# Patient Record
Sex: Female | Born: 1990 | Race: Black or African American | Hispanic: No | Marital: Single | Smoking: Former smoker
Health system: Southern US, Community
[De-identification: ages and names within clinical notes are randomized; demographics above are authoritative.]

## PROBLEM LIST (undated history)

## (undated) DIAGNOSIS — Z789 Other specified health status: Secondary | ICD-10-CM

## (undated) HISTORY — PX: NO PAST SURGERIES: SHX2092

## (undated) HISTORY — PX: TONSILLECTOMY: SUR1361

---

## 2010-05-26 ENCOUNTER — Emergency Department (HOSPITAL_COMMUNITY): Payer: Self-pay

## 2010-05-26 ENCOUNTER — Emergency Department (HOSPITAL_COMMUNITY)
Admission: EM | Admit: 2010-05-26 | Discharge: 2010-05-27 | Disposition: A | Discharge status: Home / Self Care | Payer: Self-pay | Attending: Emergency Medicine | Admitting: Emergency Medicine

## 2010-05-26 DIAGNOSIS — R1031 Right lower quadrant pain: Secondary | ICD-10-CM | POA: Insufficient documentation

## 2010-05-26 DIAGNOSIS — R110 Nausea: Secondary | ICD-10-CM | POA: Insufficient documentation

## 2010-05-26 DIAGNOSIS — N390 Urinary tract infection, site not specified: Secondary | ICD-10-CM | POA: Insufficient documentation

## 2010-05-26 LAB — DIFFERENTIAL
Basophils Absolute: 0 10*3/uL (ref 0.0–0.1)
Eosinophils Relative: 0 % (ref 0–5)
Lymphocytes Relative: 17 % (ref 12–46)
Monocytes Absolute: 1.4 10*3/uL — ABNORMAL HIGH (ref 0.1–1.0)
Monocytes Relative: 13 % — ABNORMAL HIGH (ref 3–12)
Neutro Abs: 7.3 10*3/uL (ref 1.7–7.7)

## 2010-05-26 LAB — URINALYSIS, ROUTINE W REFLEX MICROSCOPIC
Ketones, ur: 15 mg/dL — AB
Nitrite: NEGATIVE
Protein, ur: NEGATIVE mg/dL
Urine Glucose, Fasting: NEGATIVE mg/dL
pH: 6 (ref 5.0–8.0)

## 2010-05-26 LAB — BASIC METABOLIC PANEL
BUN: 10 mg/dL (ref 6–23)
CO2: 24 mEq/L (ref 19–32)
Calcium: 9 mg/dL (ref 8.4–10.5)
Creatinine, Ser: 0.92 mg/dL (ref 0.4–1.2)
GFR calc non Af Amer: >60 mL/min (ref >60)
Glucose, Bld: 101 mg/dL — ABNORMAL HIGH (ref 70–99)

## 2010-05-26 LAB — CBC
HCT: 34.3 % — ABNORMAL LOW (ref 36.0–46.0)
Hemoglobin: 11.8 g/dL — ABNORMAL LOW (ref 12.0–15.0)
MCH: 28.2 pg (ref 26.0–34.0)
MCHC: 34.4 g/dL (ref 30.0–36.0)
RDW: 14 % (ref 11.5–15.5)

## 2010-05-26 LAB — URINE MICROSCOPIC-ADD ON

## 2010-05-26 LAB — WET PREP, GENITAL

## 2010-05-26 LAB — POCT PREGNANCY, URINE: Preg Test, Ur: NEGATIVE

## 2010-05-27 ENCOUNTER — Encounter (HOSPITAL_COMMUNITY): Payer: Self-pay

## 2010-05-27 LAB — GC/CHLAMYDIA PROBE AMP, GENITAL: Chlamydia, DNA Probe: NEGATIVE

## 2010-05-27 MED ORDER — IOHEXOL 300 MG/ML  SOLN
100.0000 mL | Freq: Once | INTRAMUSCULAR | Status: AC | PRN
  Administered 2010-05-27: 100 mL via INTRAVENOUS

## 2010-07-19 ENCOUNTER — Emergency Department (HOSPITAL_BASED_OUTPATIENT_CLINIC_OR_DEPARTMENT_OTHER)
Admission: EM | Admit: 2010-07-19 | Discharge: 2010-07-19 | Disposition: A | Discharge status: Home / Self Care | Payer: Self-pay | Attending: Emergency Medicine | Admitting: Emergency Medicine

## 2010-07-19 DIAGNOSIS — F17200 Nicotine dependence, unspecified, uncomplicated: Secondary | ICD-10-CM | POA: Insufficient documentation

## 2010-07-19 DIAGNOSIS — B9689 Other specified bacterial agents as the cause of diseases classified elsewhere: Secondary | ICD-10-CM | POA: Insufficient documentation

## 2010-07-19 DIAGNOSIS — N760 Acute vaginitis: Secondary | ICD-10-CM | POA: Insufficient documentation

## 2010-07-19 DIAGNOSIS — N3090 Cystitis, unspecified without hematuria: Secondary | ICD-10-CM | POA: Insufficient documentation

## 2010-07-19 DIAGNOSIS — A499 Bacterial infection, unspecified: Secondary | ICD-10-CM | POA: Insufficient documentation

## 2010-07-19 LAB — URINALYSIS, ROUTINE W REFLEX MICROSCOPIC
Glucose, UA: NEGATIVE mg/dL
Ketones, ur: 40 mg/dL — AB
Nitrite: NEGATIVE
Specific Gravity, Urine: 1.035 — ABNORMAL HIGH (ref 1.005–1.030)
pH: 5.5 (ref 5.0–8.0)

## 2010-07-19 LAB — WET PREP, GENITAL: Yeast Wet Prep HPF POC: NONE SEEN

## 2010-07-19 LAB — URINE MICROSCOPIC-ADD ON

## 2010-07-20 LAB — GC/CHLAMYDIA PROBE AMP, GENITAL
Chlamydia, DNA Probe: NEGATIVE
GC Probe Amp, Genital: NEGATIVE

## 2011-06-16 ENCOUNTER — Encounter (HOSPITAL_BASED_OUTPATIENT_CLINIC_OR_DEPARTMENT_OTHER): Payer: Self-pay | Admitting: *Deleted

## 2011-06-16 ENCOUNTER — Emergency Department (HOSPITAL_BASED_OUTPATIENT_CLINIC_OR_DEPARTMENT_OTHER)
Admission: EM | Admit: 2011-06-16 | Discharge: 2011-06-16 | Disposition: A | Discharge status: Home Health | Payer: Self-pay | Attending: Emergency Medicine | Admitting: Emergency Medicine

## 2011-06-16 DIAGNOSIS — B373 Candidiasis of vulva and vagina: Secondary | ICD-10-CM

## 2011-06-16 DIAGNOSIS — N760 Acute vaginitis: Secondary | ICD-10-CM | POA: Insufficient documentation

## 2011-06-16 DIAGNOSIS — B3731 Acute candidiasis of vulva and vagina: Secondary | ICD-10-CM | POA: Insufficient documentation

## 2011-06-16 DIAGNOSIS — A499 Bacterial infection, unspecified: Secondary | ICD-10-CM | POA: Insufficient documentation

## 2011-06-16 DIAGNOSIS — B9689 Other specified bacterial agents as the cause of diseases classified elsewhere: Secondary | ICD-10-CM | POA: Insufficient documentation

## 2011-06-16 LAB — URINALYSIS, ROUTINE W REFLEX MICROSCOPIC
Bilirubin Urine: NEGATIVE
Hgb urine dipstick: NEGATIVE
Nitrite: NEGATIVE
Protein, ur: NEGATIVE mg/dL
Urobilinogen, UA: 1 mg/dL (ref 0.0–1.0)

## 2011-06-16 LAB — PREGNANCY, URINE: Preg Test, Ur: NEGATIVE

## 2011-06-16 LAB — WET PREP, GENITAL

## 2011-06-16 MED ORDER — METRONIDAZOLE 500 MG PO TABS
500.0000 mg | ORAL_TABLET | Freq: Once | ORAL | Status: AC
  Administered 2011-06-16: 500 mg via ORAL
  Filled 2011-06-16: qty 1

## 2011-06-16 MED ORDER — METRONIDAZOLE 500 MG PO TABS: 500.0000 mg | ORAL_TABLET | Freq: Two times a day (BID) | ORAL | Status: AC

## 2011-06-16 MED ORDER — FLUCONAZOLE 200 MG PO TABS: 200.0000 mg | ORAL_TABLET | Freq: Every day | ORAL | Status: AC

## 2011-06-16 NOTE — ED Provider Notes (Signed)
History     CSN: [id number]  Arrival date & time 06/16/11  2046   First MD Initiated Contact with Patient 06/16/11 2202      Chief Complaint  Patient presents with  . SEXUALLY TRANSMITTED DISEASE    (Consider location/radiation/quality/duration/timing/severity/associated sxs/prior treatment) The history is provided by the patient.   21 year old female comes in because of vaginal discharge for the last 3 days. Discharge is yellow and malodorous. Is getting worse. It is associated with some vaginal irritation which is significantly worse today. There is no associated abdominal pain, nausea, vomiting. She denies dysuria. She is on birth control pills but is not using condoms. Similar symptoms in the past were due to trichomonas. She describes her symptoms as severe. Nothing makes it better nothing makes it worse.  History reviewed. No pertinent past medical history.  History reviewed. No pertinent past surgical history.  No family history on file.  History  Substance Use Topics  . Smoking status: Not on file  . Smokeless tobacco: Not on file  . Alcohol Use: Not on file    OB History    Grav Para Term Preterm Abortions TAB SAB Ect Mult Living                  Review of Systems  All other systems reviewed and are negative.    Allergies  Review of patient's allergies indicates no known allergies.  Home Medications  No current outpatient prescriptions on file.  BP 116/62  Pulse 64  Temp(Src) 98 F (36.7 C) (Oral)  Resp 16  Ht 5\' 2"  (1.575 m)  Wt 136 lb (61.689 kg)  BMI 24.87 kg/m2  SpO2 100%  LMP 05/17/2011  Physical Exam  Nursing note and vitals reviewed.  21 year old female is resting comfortably and in no acute distress. Vital signs are normal. Oxygen saturation is 100% which is normal. Head is normocephalic and atraumatic. PERRLA, EOMI. Oropharynx is clear. Neck is nontender and supple. Back is nontender. Lungs are clear without rales, wheezes, or rhonchi.  Heart has regular rate and rhythm without murmur. Abdomen is soft, flat, nontender without masses or hepatosplenomegaly. There is no CVA tenderness. Pelvic: Normal external female genitalia. On speculum exam, there is a moderate amount of whitish/yellowish discharge. Cervix is closed. Specimens were sent for routine cultures. On bimanual examination, and fundus is normal size and position, there is no cervical motion tenderness, there are no adnexal masses or tenderness. Extremities have no cyanosis or edema, full range of motion present. Skin is warm and dry without rash. Neurologic: Mental status is normal, cranial nerves are intact, there no focal motor or sensory deficits.  ED Course  Procedures (including critical care time)  Results for orders placed during the hospital encounter of 06/16/11  URINALYSIS, ROUTINE W REFLEX MICROSCOPIC      Component Value Range   Color, Urine YELLOW  YELLOW    APPearance CLOUDY (*) CLEAR    Specific Gravity, Urine 1.026  1.005 - 1.030    pH 5.5  5.0 - 8.0    Glucose, UA NEGATIVE  NEGATIVE (mg/dL)   Hgb urine dipstick NEGATIVE  NEGATIVE    Bilirubin Urine NEGATIVE  NEGATIVE    Ketones, ur NEGATIVE  NEGATIVE (mg/dL)   Protein, ur NEGATIVE  NEGATIVE (mg/dL)   Urobilinogen, UA 1.0  0.0 - 1.0 (mg/dL)   Nitrite NEGATIVE  NEGATIVE    Leukocytes, UA LARGE (*) NEGATIVE   PREGNANCY, URINE      Component Value Range  Preg Test, Ur NEGATIVE  NEGATIVE   URINE MICROSCOPIC-ADD ON      Component Value Range   Squamous Epithelial / LPF RARE  RARE    WBC, UA 7-10  <3 (WBC/hpf)   Bacteria, UA RARE  RARE   WET PREP, GENITAL      Component Value Range   Yeast Wet Prep HPF POC MODERATE (*) NONE SEEN    Trich, Wet Prep NONE SEEN  NONE SEEN    Clue Cells Wet Prep HPF POC MANY (*) NONE SEEN    WBC, Wet Prep HPF POC TOO NUMEROUS TO COUNT (*) NONE SEEN    Cultures are positive for yeast and clue cells. She is given a prescription for Flagyl and a prescription for  Diflucan to take after completing a course of Flagyl. She is advised to use over-the-counter anti-yeast creams as needed until completing a course of Flagyl.  1. Bacterial vaginosis   2. Candida vaginitis       MDM  Vaginitis. STD panel has been sent.        Dione Booze, MD 06/16/11 [phone]

## 2011-06-16 NOTE — ED Notes (Signed)
Vaginal discharge 4 days. C.o yellow discharged.

## 2011-06-16 NOTE — Discharge Instructions (Signed)
Wait until after you have completed her course of Flagyl before taking Diflucan.  Bacterial Vaginosis Bacterial vaginosis (BV) is a vaginal infection where the normal balance of bacteria in the vagina is disrupted. The normal balance is then replaced by an overgrowth of certain bacteria. There are several different kinds of bacteria that can cause BV. BV is the most common vaginal infection in women of childbearing age. CAUSES   The cause of BV is not fully understood. BV develops when there is an increase or imbalance of harmful bacteria.   Some activities or behaviors can upset the normal balance of bacteria in the vagina and put women at increased risk including:   Having a new sex partner or multiple sex partners.   Douching.   Using an intrauterine device (IUD) for contraception.   It is not clear what role sexual activity plays in the development of BV. However, women that have never had sexual intercourse are rarely infected with BV.  Women do not get BV from toilet seats, bedding, swimming pools or from touching objects around them.  SYMPTOMS   Grey vaginal discharge.   A fish-like odor with discharge, especially after sexual intercourse.   Itching or burning of the vagina and vulva.   Burning or pain with urination.   Some women have no signs or symptoms at all.  DIAGNOSIS  Your caregiver must examine the vagina for signs of BV. Your caregiver will perform lab tests and look at the sample of vaginal fluid through a microscope. They will look for bacteria and abnormal cells (clue cells), a pH test higher than 4.5, and a positive amine test all associated with BV.  RISKS AND COMPLICATIONS   Pelvic inflammatory disease (PID).   Infections following gynecology surgery.   Developing HIV.   Developing herpes virus.  TREATMENT  Sometimes BV will clear up without treatment. However, all women with symptoms of BV should be treated to avoid complications, especially if  gynecology surgery is planned. Female partners generally do not need to be treated. However, BV may spread between female sex partners so treatment is helpful in preventing a recurrence of BV.   BV may be treated with antibiotics. The antibiotics come in either pill or vaginal cream forms. Either can be used with nonpregnant or pregnant women, but the recommended dosages differ. These antibiotics are not harmful to the baby.   BV can recur after treatment. If this happens, a second round of antibiotics will often be prescribed.   Treatment is important for pregnant women. If not treated, BV can cause a premature delivery, especially for a pregnant woman who had a premature birth in the past. All pregnant women who have symptoms of BV should be checked and treated.   For chronic reoccurrence of BV, treatment with a type of prescribed gel vaginally twice a week is helpful.  HOME CARE INSTRUCTIONS   Finish all medication as directed by your caregiver.   Do not have sex until treatment is completed.   Tell your sexual partner that you have a vaginal infection. They should see their caregiver and be treated if they have problems, such as a mild rash or itching.   Practice safe sex. Use condoms. Only have 1 sex partner.  PREVENTION  Basic prevention steps can help reduce the risk of upsetting the natural balance of bacteria in the vagina and developing BV:  Do not have sexual intercourse (be abstinent).   Do not douche.   Use all of the  medicine prescribed for treatment of BV, even if the signs and symptoms go away.   Tell your sex partner if you have BV. That way, they can be treated, if needed, to prevent reoccurrence.  SEEK MEDICAL CARE IF:   Your symptoms are not improving after 3 days of treatment.   You have increased discharge, pain, or fever.  MAKE SURE YOU:   Understand these instructions.   Will watch your condition.   Will get help right away if you are not doing well or get  worse.  FOR MORE INFORMATION  Division of STD Prevention (DSTDP), Centers for Disease Control and Prevention: [employer]Apps.[employer].za American Social Health Association (ASHA): www.ashastd.org  Document Released: 04/03/2005 Document Revised: 12/14/2010 Document Reviewed: 09/24/2008 [facility] Patient Information 2012 [place], [place].  Candida Infection, Adult A candida infection (also called yeast, fungus and Monilia infection) is an overgrowth of yeast that can occur anywhere on the body. A yeast infection commonly occurs in warm, moist body areas. Usually, the infection remains localized but can spread to become a systemic infection. A yeast infection may be a sign of a more severe disease such as diabetes, leukemia, or AIDS. A yeast infection can occur in both men and women. In women, Candida vaginitis is a vaginal infection. It is one of the most common causes of vaginitis. Men usually do not have symptoms or know they have an infection until other problems develop. Men may find out they have a yeast infection because their sex partner has a yeast infection. Uncircumcised men are more likely to get a yeast infection than circumcised men. This is because the uncircumcised glans is not exposed to air and does not remain as dry as that of a circumcised glans. Older adults may develop yeast infections around dentures. CAUSES  Women  Antibiotics.   Steroid medication taken for a long time.   Being overweight (obese).   Diabetes.   Poor immune condition.   Certain serious medical conditions.   Immune suppressive medications for organ transplant patients.   Chemotherapy.   Pregnancy.   Menstration.   Stress and fatigue.   Intravenous drug use.   Oral contraceptives.   Wearing tight-fitting clothes in the crotch area.   Catching it from a sex partner who has a yeast infection.   Spermicide.   Intravenous, urinary, or other catheters.  Men  Catching it from a sex partner who has a  yeast infection.   Having oral or anal sex with a person who has the infection.   Spermicide.   Diabetes.   Antibiotics.   Poor immune system.   Medications that suppress the immune system.   Intravenous drug use.   Intravenous, urinary, or other catheters.  SYMPTOMS  Women  Thick, white vaginal discharge.   Vaginal itching.   Redness and swelling in and around the vagina.   Irritation of the lips of the vagina and perineum.   Blisters on the vaginal lips and perineum.   Painful sexual intercourse.   Low blood sugar (hypoglycemia).   Painful urination.   Bladder infections.   Intestinal problems such as constipation, indigestion, bad breath, bloating, increase in gas, diarrhea, or loose stools.  Men  Men may develop intestinal problems such as constipation, indigestion, bad breath, bloating, increase in gas, diarrhea, or loose stools.   Dry, cracked skin on the penis with itching or discomfort.   Jock itch.   Dry, flaky skin.   Athlete's foot.   Hypoglycemia.  DIAGNOSIS  Women  A  history and an exam are performed.   The discharge may be examined under a microscope.   A culture may be taken of the discharge.  Men  A history and an exam are performed.   Any discharge from the penis or areas of cracked skin will be looked at under the microscope and cultured.   Stool samples may be cultured.  TREATMENT  Women  Vaginal antifungal suppositories and creams.   Medicated creams to decrease irritation and itching on the outside of the vagina.   Warm compresses to the perineal area to decrease swelling and discomfort.   Oral antifungal medications.   Medicated vaginal suppositories or cream for repeated or recurrent infections.   Wash and dry the irritation areas before applying the cream.   Eating yogurt with lactobacillus may help with prevention and treatment.   Sometimes painting the vagina with gentian violet solution may help if creams  and suppositories do not work.  Men  Antifungal creams and oral antifungal medications.   Sometimes treatment must continue for 30 days after the symptoms go away to prevent recurrence.  HOME CARE INSTRUCTIONS  Women  Use cotton underwear and avoid tight-fitting clothing.   Avoid colored, scented toilet paper and deodorant tampons or pads.   Do not douche.   Keep your diabetes under control.   Finish all the prescribed medications.   Keep your skin clean and dry.   Consume milk or yogurt with lactobacillus active culture regularly. If you get frequent yeast infections and think that is what the infection is, there are over-the-counter medications that you can get. If the infection does not show healing in 3 days, talk to your caregiver.   Tell your sex partner you have a yeast infection. Your partner may need treatment also, especially if your infection does not clear up or recurs.  Men  Keep your skin clean and dry.   Keep your diabetes under control.   Finish all prescribed medications.   Tell your sex partner that you have a yeast infection so they can be treated if necessary.  SEEK MEDICAL CARE IF:   Your symptoms do not clear up or worsen in one week after treatment.   You have an oral temperature above 102 F (38.9 C).   You have trouble swallowing or eating for a prolonged time.   You develop blisters on and around your vagina.   You develop vaginal bleeding and it is not your menstrual period.   You develop abdominal pain.   You develop intestinal problems as mentioned above.   You get weak or lightheaded.   You have painful or increased urination.   You have pain during sexual intercourse.  MAKE SURE YOU:   Understand these instructions.   Will watch your condition.   Will get help right away if you are not doing well or get worse.  Document Released: 05/11/2004 Document Revised: 12/14/2010 Document Reviewed: 08/23/2009 [facility] Patient  Information 2012 [place], [place].

## 2011-06-17 LAB — GC/CHLAMYDIA PROBE AMP, GENITAL
Chlamydia, DNA Probe: NEGATIVE
GC Probe Amp, Genital: NEGATIVE

## 2011-06-17 LAB — HIV ANTIBODY (ROUTINE TESTING W REFLEX): HIV: NONREACTIVE

## 2012-01-10 IMAGING — CT CT ABD-PELV W/ CM
2 of 4 series · 17 of 46 positions shown, 19 images · IV contrast (agent unspecified)
Comparison: Linear subsegmental atelectasis is present in the
posterior basal segment right lower lobe.

CLINICAL DATA: Right lower quadrant abdominal pain.  Nausea.

CT ABDOMEN AND PELVIS WITH CONTRAST
TECHNIQUE: Multidetector CT imaging of the abdomen and pelvis was
performed following the standard protocol during bolus
administration of intravenous contrast.
Contrast: 100 ml 0mnipaque-URR

[Series 2: rtn ap with st · axial · 0.55mm/px · z∈[+834,+1210]mm · 14 of 83 slices shown, 16 images]
[im 4/83  soft-tissue]
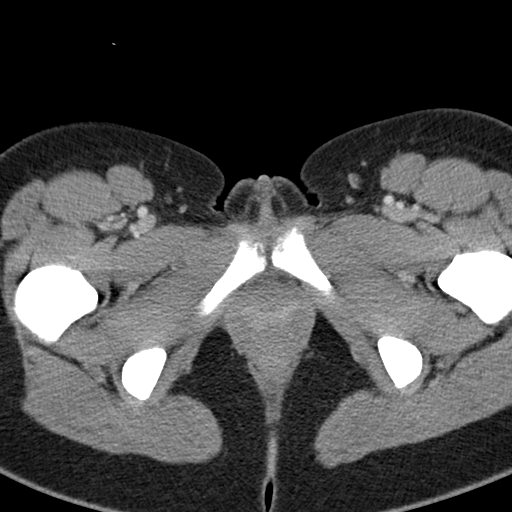
[im 4/83  bone]
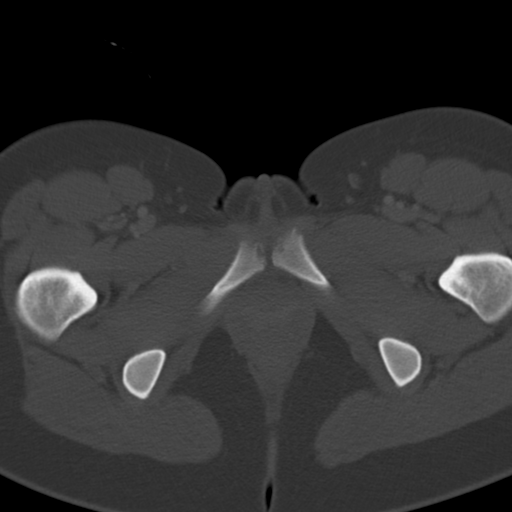
[im 10/83  soft-tissue]
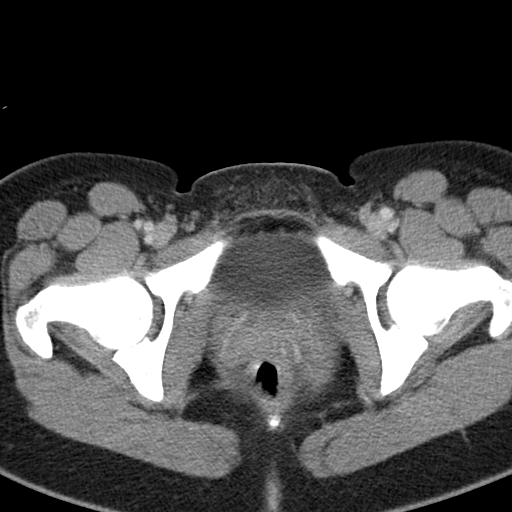
[im 16/83  soft-tissue]
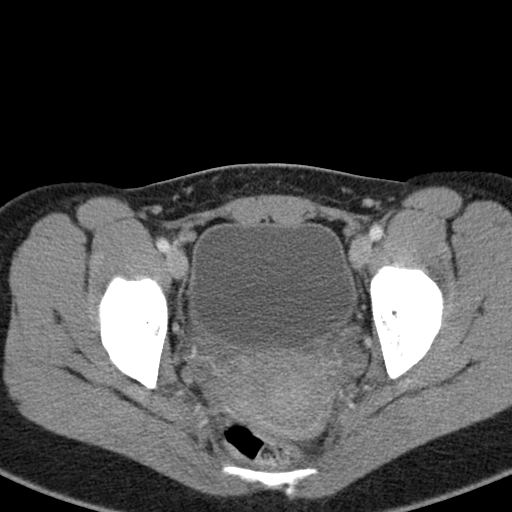
[im 23/83  soft-tissue]
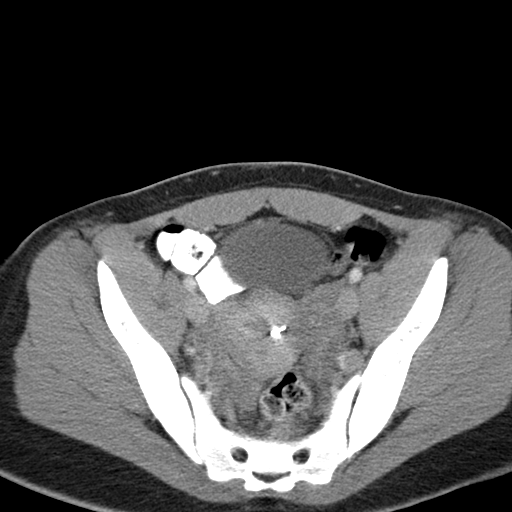
[im 29/83  soft-tissue]
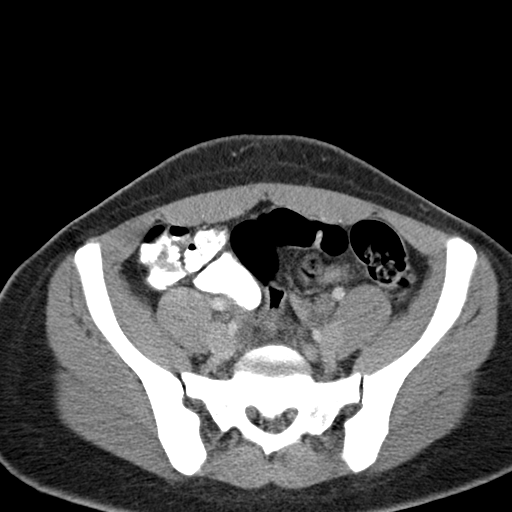
[im 32/83  soft-tissue]
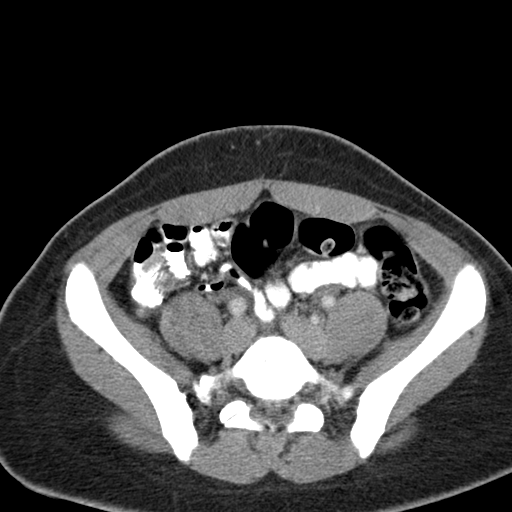
[im 38/83  soft-tissue]
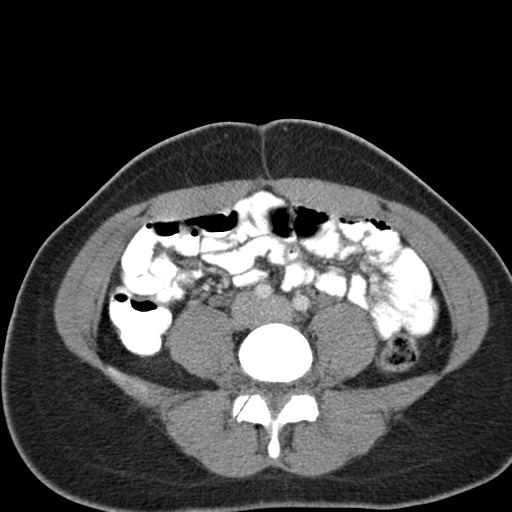
[im 45/83  soft-tissue]
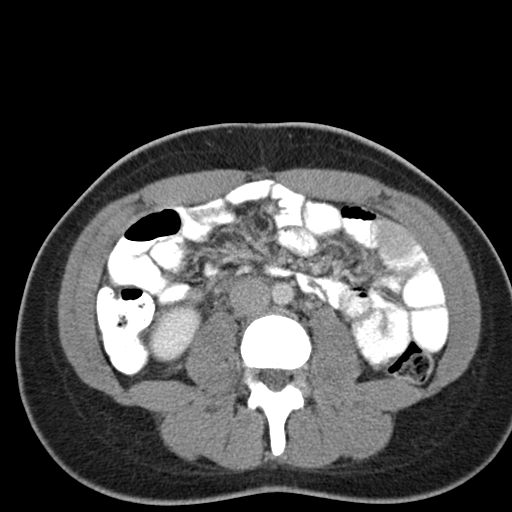
[im 51/83  soft-tissue]
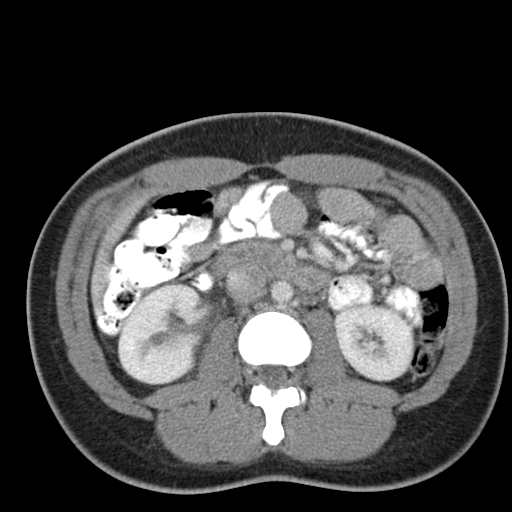
[im 51/83  bone]
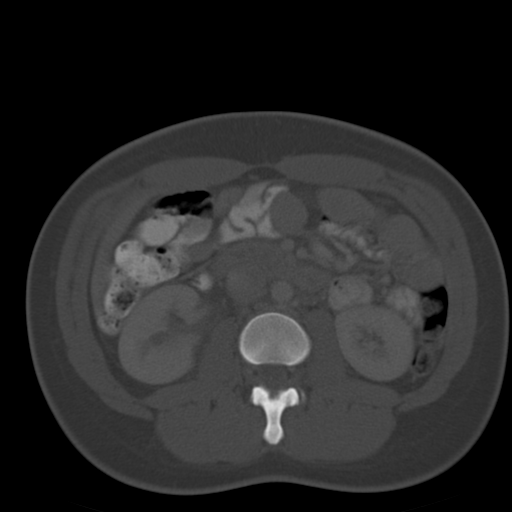
[im 54/83  soft-tissue]
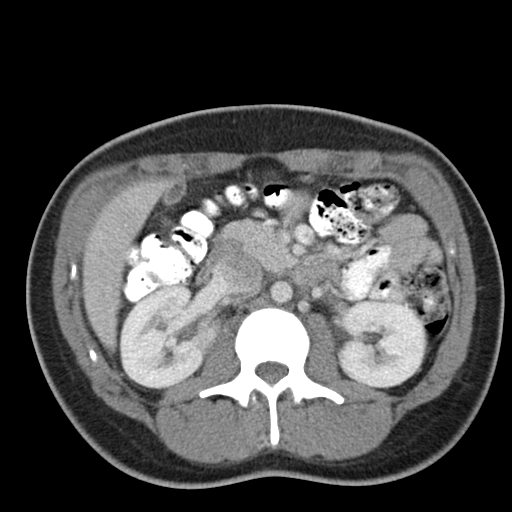
[im 60/83  soft-tissue]
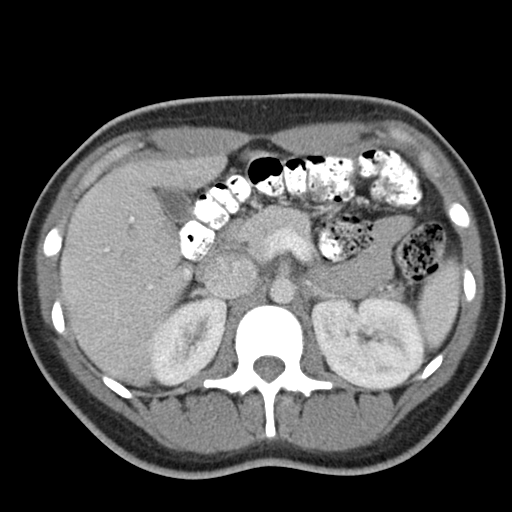
[im 67/83  soft-tissue]
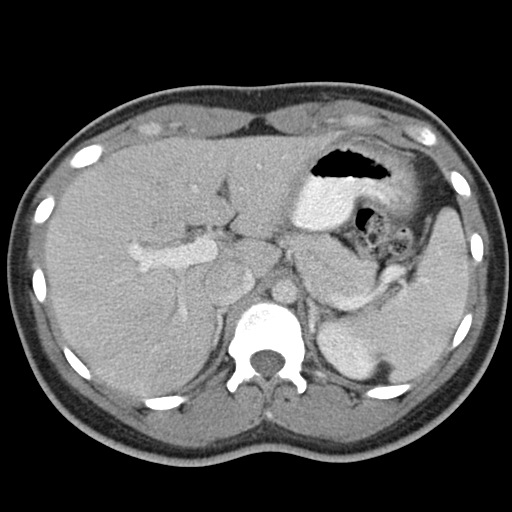
[im 73/83  soft-tissue]
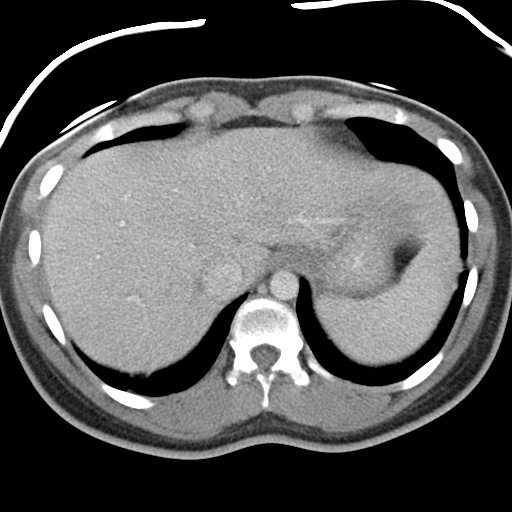
[im 79/83  soft-tissue]
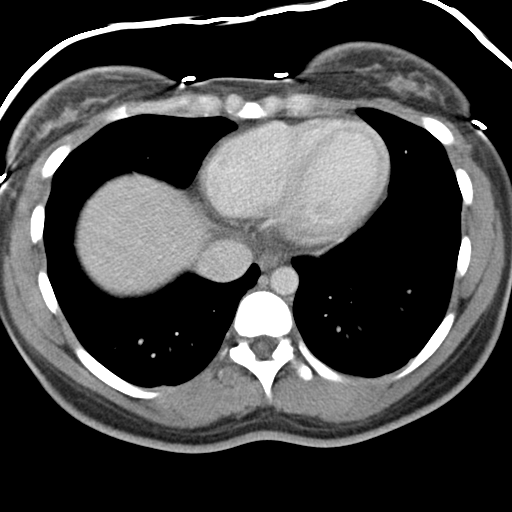

[Series 602: <mpr thick range> · coronal · 0.84mm/px · 3 of 74 slices shown]
[im 25/74  soft-tissue]
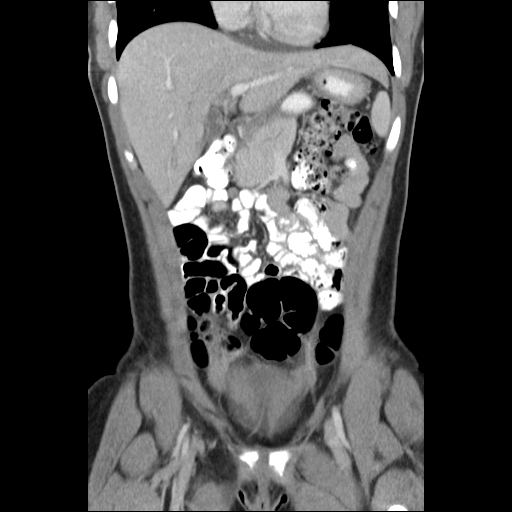
[im 33/74  soft-tissue]
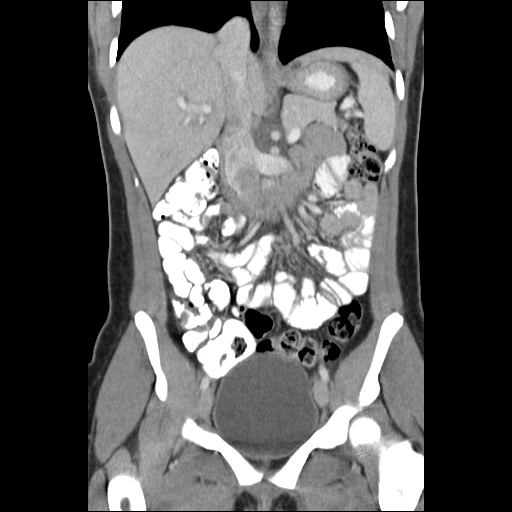
[im 41/74  soft-tissue]
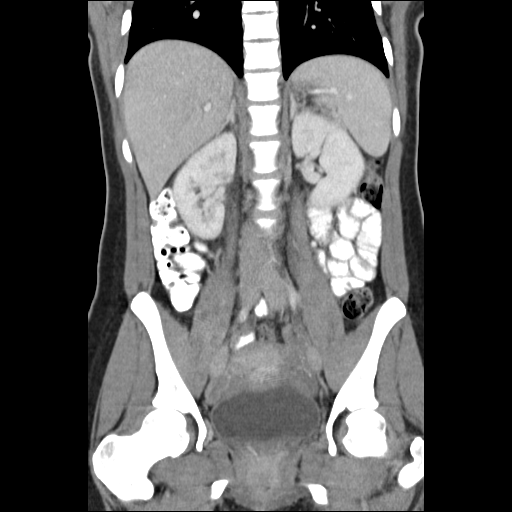

[17 of 46 positions shown; findings below may reference images not displayed]

The liver, spleen, pancreas, and adrenal glands appear
unremarkable.

The gallbladder is contracted.

The appendix appears normal.

There is proximal periureteral stranding and some slight stranding
in the right collecting system.  Borderline right hydroureter
extends to the iliac vessel crossover in the right ureter is
indistinct distal to this point.  However, I do not discern a stone
along the expected course of the right ureter.  I cannot exclude a
recently passed stone.  No parenchymal enhancement abnormality in
the right kidney to suggest overt pyelonephritis.  No gas is
evident in the right collecting system.

No dilated bowel noted.

An IUD is present in the uterus and appears satisfactorily
positioned.

The adnexa and parametrial vasculature appear relatively symmetric.
The last
FINDINGS: 1.  Mild stranding around the right collecting system and right
proximal ureter, possibly related to inflammation.  This could be
from recent stone passage or possibly low-level infection.  No
enhancement abnormality of the right kidney to suggest overt
pyelonephritis.
2.  The appendix appears normal, and no other significant right-
sided abnormality is identified.
IMPRESSION:

## 2012-02-03 ENCOUNTER — Emergency Department (HOSPITAL_BASED_OUTPATIENT_CLINIC_OR_DEPARTMENT_OTHER)
Admission: EM | Admit: 2012-02-03 | Discharge: 2012-02-03 | Disposition: A | Discharge status: Home / Self Care | Payer: Medicaid Other | Attending: Emergency Medicine | Admitting: Emergency Medicine

## 2012-02-03 ENCOUNTER — Encounter (HOSPITAL_BASED_OUTPATIENT_CLINIC_OR_DEPARTMENT_OTHER): Payer: Self-pay | Admitting: *Deleted

## 2012-02-03 DIAGNOSIS — A499 Bacterial infection, unspecified: Secondary | ICD-10-CM | POA: Insufficient documentation

## 2012-02-03 DIAGNOSIS — B9689 Other specified bacterial agents as the cause of diseases classified elsewhere: Secondary | ICD-10-CM

## 2012-02-03 DIAGNOSIS — Z202 Contact with and (suspected) exposure to infections with a predominantly sexual mode of transmission: Secondary | ICD-10-CM

## 2012-02-03 DIAGNOSIS — N760 Acute vaginitis: Secondary | ICD-10-CM | POA: Insufficient documentation

## 2012-02-03 LAB — URINALYSIS, ROUTINE W REFLEX MICROSCOPIC
Bilirubin Urine: NEGATIVE
Glucose, UA: NEGATIVE mg/dL
Hgb urine dipstick: NEGATIVE
Ketones, ur: NEGATIVE mg/dL
Nitrite: NEGATIVE
Protein, ur: NEGATIVE mg/dL
Specific Gravity, Urine: 1.022 (ref 1.005–1.030)
Urobilinogen, UA: 1 mg/dL (ref 0.0–1.0)
pH: 7 (ref 5.0–8.0)

## 2012-02-03 LAB — URINE MICROSCOPIC-ADD ON

## 2012-02-03 LAB — PREGNANCY, URINE: Preg Test, Ur: NEGATIVE

## 2012-02-03 LAB — WET PREP, GENITAL
Trich, Wet Prep: NONE SEEN
Yeast Wet Prep HPF POC: NONE SEEN

## 2012-02-03 MED ORDER — LIDOCAINE HCL (PF) 1 % IJ SOLN
INTRAMUSCULAR | Status: AC
  Administered 2012-02-03: 2.1 mL
  Filled 2012-02-03: qty 5

## 2012-02-03 MED ORDER — CEFTRIAXONE SODIUM 1 G IJ SOLR
1.0000 g | Freq: Once | INTRAMUSCULAR | Status: AC
  Administered 2012-02-03: 1 g via INTRAMUSCULAR
  Filled 2012-02-03: qty 10

## 2012-02-03 MED ORDER — AZITHROMYCIN 250 MG PO TABS
1000.0000 mg | ORAL_TABLET | Freq: Once | ORAL | Status: AC
  Administered 2012-02-03: 1000 mg via ORAL
  Filled 2012-02-03: qty 4

## 2012-02-03 MED ORDER — METRONIDAZOLE 500 MG PO TABS: 500.0000 mg | ORAL_TABLET | Freq: Two times a day (BID) | ORAL | Status: DC

## 2012-02-03 NOTE — ED Notes (Signed)
Pt states she has been exposed to chlamydia and has had foul-smelling urine and vaginal irritation x 1 week

## 2012-02-03 NOTE — ED Provider Notes (Signed)
Medical screening examination/treatment/procedure(s) were performed by non-physician practitioner and as supervising physician I was immediately available for consultation/collaboration.   Gavin Pound. Ladelle Teodoro, MD 02/03/12 1544

## 2012-02-03 NOTE — ED Provider Notes (Signed)
History     CSN: [id number]  Arrival date & time 02/03/12  1320   First MD Initiated Contact with Patient 02/03/12 1443      Chief Complaint  Patient presents with  . Exposure to STD    (Consider location/radiation/quality/duration/timing/severity/associated sxs/prior treatment) HPI Comments: 21 year old female presents the emergency department stating she has been exposed to chlamydia from her baby's father. He admitted to patient that he had intercourse with a woman who had Chlamydia 1 month ago, and patient had intercourse with him about 2 weeks ago. States for the past week she has been having dysuria, foul-smelling urine, and increased frequency. There is associated thick white discharge and vaginal itching. She has not tried any alleviating factors. Admits to mild pelvic discomfort, but states this is not too bad. Denies vaginal bleeding or pain. No fever, chills, nausea or vomiting.  Patient is a 21 y.o. female presenting with STD exposure. The history is provided by the patient.  Exposure to STD Pertinent negatives include no chest pain, chills, fever, nausea, neck pain or vomiting.    History reviewed. No pertinent past medical history.  History reviewed. No pertinent past surgical history.  History reviewed. No pertinent family history.  History  Substance Use Topics  . Smoking status: Current Every Day Smoker -- 0.3 packs/day    Types: Cigarettes  . Smokeless tobacco: Not on file  . Alcohol Use: Not on file    OB History    Grav Para Term Preterm Abortions TAB SAB Ect Mult Living                  Review of Systems  Constitutional: Negative for fever and chills.  HENT: Negative for neck pain and neck stiffness.   Eyes: Negative for discharge.  Respiratory: Negative for shortness of breath.   Cardiovascular: Negative for chest pain.  Gastrointestinal: Negative for nausea and vomiting.  Genitourinary: Positive for dysuria, frequency, vaginal discharge and  pelvic pain (mild). Negative for urgency, hematuria, vaginal bleeding and vaginal pain.  Musculoskeletal: Negative for back pain.  Neurological: Negative for dizziness and light-headedness.  Psychiatric/Behavioral: The patient is not nervous/anxious.     Allergies  Review of patient's allergies indicates no known allergies.  Home Medications  No current outpatient prescriptions on file.  BP 113/65  Pulse 64  Temp 99 F (37.2 C) (Oral)  Resp 18  Ht 5\' 2"  (1.575 m)  Wt 129 lb (58.514 kg)  BMI 23.59 kg/m2  SpO2 99%  LMP 01/16/2012  Physical Exam  Constitutional: She is oriented to person, place, and time. She appears well-developed and well-nourished. No distress.  HENT:  Head: Normocephalic and atraumatic.  Eyes: Conjunctivae normal and EOM are normal. Right eye exhibits no discharge. Left eye exhibits no discharge.  Neck: Normal range of motion. Neck supple.  Cardiovascular: Normal rate, regular rhythm and normal heart sounds.   Pulmonary/Chest: Effort normal and breath sounds normal.  Abdominal: Soft. Bowel sounds are normal. There is no tenderness.  Genitourinary: Uterus normal. Cervix exhibits discharge (yellow). Cervix exhibits no motion tenderness and no friability. Right adnexum displays no mass, no tenderness and no fullness. Left adnexum displays no mass, no tenderness and no fullness. No erythema, tenderness or bleeding around the vagina. Vaginal discharge (copious, yellow) found.  Musculoskeletal: Normal range of motion. She exhibits no edema.  Neurological: She is alert and oriented to person, place, and time.  Skin: Skin is warm and dry. No rash noted.  Psychiatric: She has a normal mood  and affect. Her behavior is normal.    ED Course  Procedures (including critical care time)  Labs Reviewed  URINALYSIS, ROUTINE W REFLEX MICROSCOPIC - Abnormal; Notable for the following:    Leukocytes, UA TRACE (*)     All other components within normal limits  URINE  MICROSCOPIC-ADD ON - Abnormal; Notable for the following:    Squamous Epithelial / LPF FEW (*)     Bacteria, UA FEW (*)     All other components within normal limits  PREGNANCY, URINE  GC/CHLAMYDIA PROBE AMP, GENITAL  WET PREP, GENITAL   No results found.   1. Exposure to chlamydia   2. BV (bacterial vaginosis)       MDM  21 y/o female exposed to chlamydia presenting with vaginal discharge and foul smelling urine. Rocephin and azithromycin given due to known exposure. No CMT. Rx flagyl for BV which is more than likely causing her vaginal odor. Return precautions discussed.       Trevor Mace, PA-C 02/03/12 1537

## 2012-04-21 ENCOUNTER — Emergency Department (HOSPITAL_BASED_OUTPATIENT_CLINIC_OR_DEPARTMENT_OTHER)
Admission: EM | Admit: 2012-04-21 | Discharge: 2012-04-21 | Disposition: A | Discharge status: Home / Self Care | Payer: Medicaid Other | Attending: Emergency Medicine | Admitting: Emergency Medicine

## 2012-04-21 ENCOUNTER — Encounter (HOSPITAL_BASED_OUTPATIENT_CLINIC_OR_DEPARTMENT_OTHER): Payer: Self-pay | Admitting: *Deleted

## 2012-04-21 DIAGNOSIS — B3731 Acute candidiasis of vulva and vagina: Secondary | ICD-10-CM | POA: Insufficient documentation

## 2012-04-21 DIAGNOSIS — N760 Acute vaginitis: Secondary | ICD-10-CM | POA: Insufficient documentation

## 2012-04-21 DIAGNOSIS — B9689 Other specified bacterial agents as the cause of diseases classified elsewhere: Secondary | ICD-10-CM

## 2012-04-21 DIAGNOSIS — F17200 Nicotine dependence, unspecified, uncomplicated: Secondary | ICD-10-CM | POA: Insufficient documentation

## 2012-04-21 DIAGNOSIS — Z3202 Encounter for pregnancy test, result negative: Secondary | ICD-10-CM | POA: Insufficient documentation

## 2012-04-21 DIAGNOSIS — B373 Candidiasis of vulva and vagina: Secondary | ICD-10-CM

## 2012-04-21 LAB — URINALYSIS, ROUTINE W REFLEX MICROSCOPIC
Hgb urine dipstick: NEGATIVE
Ketones, ur: NEGATIVE mg/dL
Protein, ur: NEGATIVE mg/dL
Urobilinogen, UA: 1 mg/dL (ref 0.0–1.0)

## 2012-04-21 LAB — URINE MICROSCOPIC-ADD ON

## 2012-04-21 LAB — WET PREP, GENITAL: Trich, Wet Prep: NONE SEEN

## 2012-04-21 LAB — PREGNANCY, URINE: Preg Test, Ur: NEGATIVE

## 2012-04-21 MED ORDER — FLUCONAZOLE 150 MG PO TABS: 150.0000 mg | ORAL_TABLET | Freq: Once | ORAL | Status: DC

## 2012-04-21 MED ORDER — METRONIDAZOLE 500 MG PO TABS: 500.0000 mg | ORAL_TABLET | Freq: Two times a day (BID) | ORAL | Status: DC

## 2012-04-21 NOTE — Discharge Instructions (Signed)
Candidal Vulvovaginitis Candidal vulvovaginitis is an infection of the vagina and vulva. The vulva is the skin around the opening of the vagina. This may cause itching and discomfort in and around the vagina.  HOME CARE  Only take medicine as told by your doctor.  Do not have sex (intercourse) until the infection is healed or as told by your doctor.  Practice safe sex.  Tell your sex partner about your infection.  Do not douche or use tampons.  Wear cotton underwear. Do not wear tight pants or panty hose.  Eat yogurt. This may help treat and prevent yeast infections. GET HELP RIGHT AWAY IF:   You have a fever.  Your problems get worse during treatment or do not get better in 3 days.  You have discomfort, irritation, or itching in your vagina or vulva area.  You have pain after sex.  You start to get belly (abdominal) pain. MAKE SURE YOU:  Understand these instructions.  Will watch your condition.  Will get help right away if you are not doing well or get worse. Document Released: 06/30/2008 Document Revised: 06/26/2011 Document Reviewed: 06/30/2008 [facility] Patient Information 2013 [place], [place]. Bacterial Vaginosis Bacterial vaginosis is an infection of the vagina. A healthy vagina has many kinds of good germs (bacteria). Sometimes the number of good germs can change. This allows bad germs to move in and cause an infection. You may be given medicine (antibiotics) to treat the infection. Or, you may not need treatment at all. HOME CARE  Take your medicine as told. Finish them even if you start to feel better.  Do not have sex until you finish your medicine.  Do not douche.  Practice safe sex.  Tell your sex partner that you have an infection. They should see their doctor for treatment if they have problems. GET HELP RIGHT AWAY IF:  You do not get better after 3 days of treatment.  You have grey fluid (discharge) coming from your vagina.  You have pain.  You  have a temperature of 102 F (38.9 C) or higher. MAKE SURE YOU:   Understand these instructions.  Will watch your condition.  Will get help right away if you are not doing well or get worse. Document Released: 01/11/2008 Document Revised: 06/26/2011 Document Reviewed: 01/11/2008 [facility] Patient Information 2013 [place], [place].

## 2012-04-21 NOTE — ED Provider Notes (Signed)
History     CSN: [id number]  Arrival date & time 04/21/12  1301   First MD Initiated Contact with Patient 04/21/12 1423      Chief Complaint  Patient presents with  . Vaginal Discharge    (Consider location/radiation/quality/duration/timing/severity/associated sxs/prior treatment) HPI Comments: Patient presents with a chief complaint of whitish/yellowish colored vaginal discharge that has been present for the past 3-4 days.  She reports that discharge is associated with some external vaginal itching and irritation.  She is sexually active.  She has been diagnosed with BV in the past.  Patient is a 22 y.o. female presenting with vaginal discharge. The history is provided by the patient.  Vaginal Discharge This is a new problem. Episode onset: 3-4 days ago. The problem has been gradually worsening. Pertinent negatives include no abdominal pain, fever, nausea, rash, urinary symptoms or vomiting. She has tried nothing for the symptoms.    History reviewed. No pertinent past medical history.  History reviewed. No pertinent past surgical history.  History reviewed. No pertinent family history.  History  Substance Use Topics  . Smoking status: Current Every Day Smoker -- 0.3 packs/day    Types: Cigarettes  . Smokeless tobacco: Not on file  . Alcohol Use: Not on file    OB History    Grav Para Term Preterm Abortions TAB SAB Ect Mult Living                  Review of Systems  Constitutional: Negative for fever.  Gastrointestinal: Negative for nausea, vomiting and abdominal pain.  Genitourinary: Positive for vaginal discharge. Negative for dysuria, frequency, vaginal bleeding, vaginal pain, pelvic pain and dyspareunia.       Vaginal itching  Skin: Negative for rash.  All other systems reviewed and are negative.    Allergies  Review of patient's allergies indicates no known allergies.  Home Medications   Current Outpatient Rx  Name  Route  Sig  Dispense  Refill  .  METRONIDAZOLE 500 MG PO TABS   Oral   Take 1 tablet (500 mg total) by mouth 2 (two) times daily. One po bid x 7 days   14 tablet   0     BP 120/64  Pulse 72  Temp 98.3 F (36.8 C) (Oral)  Resp 18  Ht 5\' 2"  (1.575 m)  Wt 125 lb (56.7 kg)  BMI 22.86 kg/m2  SpO2 100%  LMP 03/19/2012  Physical Exam  Nursing note and vitals reviewed. Constitutional: She appears well-developed and well-nourished. No distress.  HENT:  Head: Normocephalic and atraumatic.  Mouth/Throat: Oropharynx is clear and moist.  Cardiovascular: Normal rate, regular rhythm and normal heart sounds.   Pulmonary/Chest: Effort normal and breath sounds normal.  Abdominal: Soft. Bowel sounds are normal. She exhibits no distension and no mass. There is no tenderness. There is no rebound and no guarding.  Genitourinary: Cervix exhibits no motion tenderness. Right adnexum displays no mass, no tenderness and no fullness. Left adnexum displays no mass, no tenderness and no fullness. Vaginal discharge found.       Thick whitish discharge in the vaginal vault  Neurological: She is alert.  Skin: Skin is warm and dry. She is not diaphoretic.  Psychiatric: She has a normal mood and affect.    ED Course  Procedures (including critical care time)  Labs Reviewed  URINALYSIS, ROUTINE W REFLEX MICROSCOPIC - Abnormal; Notable for the following:    Leukocytes, UA SMALL (*)     All other components  within normal limits  URINE MICROSCOPIC-ADD ON - Abnormal; Notable for the following:    Squamous Epithelial / LPF FEW (*)     Bacteria, UA FEW (*)     All other components within normal limits  PREGNANCY, URINE  GC/CHLAMYDIA PROBE AMP  WET PREP, GENITAL   No results found.   No diagnosis found.    MDM  Patient presenting with vaginal discharge.  Wet prep shows moderate clue cells and yeast.  Patient treated with Flagyl for BV and Diflucan for yeast.  GC/Chlamydia pending.  Patient afebrile.  No CMT on exam.           Magnus Sinning, PA-C 04/21/12 2215

## 2012-04-21 NOTE — ED Notes (Signed)
Pt states she has a hx of BV and feels like this is what is going on. Vaginal irritation and whitish, yellow d/c

## 2012-04-22 LAB — GC/CHLAMYDIA PROBE AMP: CT Probe RNA: NEGATIVE

## 2012-04-25 NOTE — ED Provider Notes (Signed)
Medical screening examination/treatment/procedure(s) were performed by non-physician practitioner and as supervising physician I was immediately available for consultation/collaboration.   Rolan Bucco, MD 04/25/12 [phone]

## 2013-03-15 ENCOUNTER — Emergency Department (HOSPITAL_BASED_OUTPATIENT_CLINIC_OR_DEPARTMENT_OTHER): Payer: Medicaid Other

## 2013-03-15 ENCOUNTER — Encounter (HOSPITAL_BASED_OUTPATIENT_CLINIC_OR_DEPARTMENT_OTHER): Payer: Self-pay | Admitting: Emergency Medicine

## 2013-03-15 ENCOUNTER — Emergency Department (HOSPITAL_BASED_OUTPATIENT_CLINIC_OR_DEPARTMENT_OTHER)
Admission: EM | Admit: 2013-03-15 | Discharge: 2013-03-15 | Disposition: A | Discharge status: Home / Self Care | Payer: Medicaid Other | Attending: Emergency Medicine | Admitting: Emergency Medicine

## 2013-03-15 DIAGNOSIS — F17200 Nicotine dependence, unspecified, uncomplicated: Secondary | ICD-10-CM | POA: Insufficient documentation

## 2013-03-15 DIAGNOSIS — Z792 Long term (current) use of antibiotics: Secondary | ICD-10-CM | POA: Insufficient documentation

## 2013-03-15 DIAGNOSIS — Z23 Encounter for immunization: Secondary | ICD-10-CM | POA: Insufficient documentation

## 2013-03-15 DIAGNOSIS — S0003XA Contusion of scalp, initial encounter: Secondary | ICD-10-CM | POA: Insufficient documentation

## 2013-03-15 DIAGNOSIS — S0093XA Contusion of unspecified part of head, initial encounter: Secondary | ICD-10-CM

## 2013-03-15 MED ORDER — TETANUS-DIPHTH-ACELL PERTUSSIS 5-2.5-18.5 LF-MCG/0.5 IM SUSP
0.5000 mL | Freq: Once | INTRAMUSCULAR | Status: AC
  Administered 2013-03-15: 0.5 mL via INTRAMUSCULAR

## 2013-03-15 MED ORDER — TETANUS-DIPHTH-ACELL PERTUSSIS 5-2.5-18.5 LF-MCG/0.5 IM SUSP
INTRAMUSCULAR | Status: AC
  Filled 2013-03-15: qty 0.5
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
## 2013-03-15 NOTE — ED Provider Notes (Signed)
Medical screening examination/treatment/procedure(s) were performed by non-physician practitioner and as supervising physician I was immediately available for consultation/collaboration.  EKG Interpretation   None        Ethelda Chick, MD 03/15/13 2013
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
## 2015-05-04 ENCOUNTER — Encounter (HOSPITAL_COMMUNITY): Payer: Self-pay | Admitting: *Deleted

## 2015-05-04 ENCOUNTER — Inpatient Hospital Stay (HOSPITAL_COMMUNITY): Payer: Medicaid Other

## 2015-05-04 ENCOUNTER — Inpatient Hospital Stay (HOSPITAL_COMMUNITY)
Admission: AD | Admit: 2015-05-04 | Discharge: 2015-05-04 | Disposition: A | Discharge status: Home / Self Care | Payer: Medicaid Other | Source: Ambulatory Visit | Attending: Family Medicine | Admitting: Family Medicine

## 2015-05-04 DIAGNOSIS — O021 Missed abortion: Secondary | ICD-10-CM | POA: Insufficient documentation

## 2015-05-04 DIAGNOSIS — R58 Hemorrhage, not elsewhere classified: Secondary | ICD-10-CM

## 2015-05-04 HISTORY — DX: Other specified health status: Z78.9

## 2015-05-04 LAB — POCT PREGNANCY, URINE: Preg Test, Ur: POSITIVE — AB

## 2015-05-04 LAB — CBC
HCT: 36.5 % (ref 36.0–46.0)
Hemoglobin: 12.4 g/dL (ref 12.0–15.0)
MCH: 29.3 pg (ref 26.0–34.0)
MCHC: 34 g/dL (ref 30.0–36.0)
MCV: 86.3 fL (ref 78.0–100.0)
Platelets: 264 10*3/uL (ref 150–400)
RBC: 4.23 MIL/uL (ref 3.87–5.11)
RDW: 13.4 % (ref 11.5–15.5)
WBC: 8.1 10*3/uL (ref 4.0–10.5)

## 2015-05-04 LAB — URINALYSIS, ROUTINE W REFLEX MICROSCOPIC
Bilirubin Urine: NEGATIVE
Glucose, UA: NEGATIVE mg/dL
Hgb urine dipstick: NEGATIVE
Ketones, ur: NEGATIVE mg/dL
Leukocytes, UA: NEGATIVE
Nitrite: NEGATIVE
Protein, ur: NEGATIVE mg/dL
Specific Gravity, Urine: >1.03 — ABNORMAL HIGH (ref 1.005–1.030)
pH: 6 (ref 5.0–8.0)

## 2015-05-04 LAB — ABO/RH: ABO/RH(D): O NEG

## 2015-05-04 LAB — HCG, QUANTITATIVE, PREGNANCY: hCG, Beta Chain, Quant, S: 3660 m[IU]/mL — ABNORMAL HIGH (ref <5)

## 2015-05-04 MED ORDER — RHO D IMMUNE GLOBULIN 1500 UNIT/2ML IJ SOSY
300.0000 ug | PREFILLED_SYRINGE | Freq: Once | INTRAMUSCULAR | Status: AC
  Administered 2015-05-04: 300 ug via INTRAMUSCULAR
  Filled 2015-05-04: qty 2

## 2015-05-04 NOTE — Discharge Instructions (Signed)
Rh0 [D] Immune Globulin injection What is this medicine? RhO [D] IMMUNE GLOBULIN (i MYOON GLOB yoo lin) is used to treat idiopathic thrombocytopenic purpura (ITP). This medicine is used in RhO negative mothers who are pregnant with a RhO positive child. It is also used after a transfusion of RhO positive blood into a RhO negative person. This medicine may be used for other purposes; ask your health care provider or pharmacist if you have questions. What should I tell my health care provider before I take this medicine? They need to know if you have any of these conditions: -bleeding disorders -low levels of immunoglobulin A in the body -no spleen -an unusual or allergic reaction to human immune globulin, other medicines, foods, dyes, or preservatives -pregnant or trying to get pregnant -breast-feeding How should I use this medicine? This medicine is for injection into a muscle or into a vein. It is given by a health care professional in a hospital or clinic setting. Talk to your pediatrician regarding the use of this medicine in children. This medicine is not approved for use in children. Overdosage: If you think you have taken too much of this medicine contact a poison control center or emergency room at once. NOTE: This medicine is only for you. Do not share this medicine with others. What if I miss a dose? It is important not to miss your dose. Call your doctor or health care professional if you are unable to keep an appointment. What may interact with this medicine? -live virus vaccines, like measles, mumps, or rubella This list may not describe all possible interactions. Give your health care provider a list of all the medicines, herbs, non-prescription drugs, or dietary supplements you use. Also tell them if you smoke, drink alcohol, or use illegal drugs. Some items may interact with your medicine. What should I watch for while using this medicine? This medicine is made from human blood.  It may be possible to pass an infection in this medicine. Talk to your doctor about the risks and benefits of this medicine. This medicine may interfere with live virus vaccines. Before you get live virus vaccines tell your health care professional if you have received this medicine within the past 3 months. What side effects may I notice from receiving this medicine? Side effects that you should report to your doctor or health care professional as soon as possible: -allergic reactions like skin rash, itching or hives, swelling of the face, lips, or tongue -breathing problems -chest pain or tightness -yellowing of the eyes or skin Side effects that usually do not require medical attention (report to your doctor or health care professional if they continue or are bothersome): -fever -pain and tenderness at site where injected This list may not describe all possible side effects. Call your doctor for medical advice about side effects. You may report side effects to FDA at 1-800-FDA-1088. Where should I keep my medicine? This drug is given in a hospital or clinic and will not be stored at home. NOTE: This sheet is a summary. It may not cover all possible information. If you have questions about this medicine, talk to your doctor, pharmacist, or health care provider.    2016, Elsevier/Gold Standard. (2007-12-02 14:06:10)  

## 2015-05-04 NOTE — Progress Notes (Signed)
Written and verbal d/c instructions given earlier by Illene Bolus CNM and understanding voiced

## 2015-05-04 NOTE — MAU Provider Note (Signed)
History     CSN: [id number]  Arrival date and time: 05/04/15 4098   First Provider Initiated Contact with Patient 05/04/15 1216      Chief Complaint  Patient presents with  . Abdominal Pain  . Vaginal Bleeding   HPI  25 y.o. [facility] [phone] @ [redacted]w[redacted]d presents with abdominal cramping and passing a small blood clot this morning     Past Medical History  Diagnosis Date  . Medical history non-contributory     Past Surgical History  Procedure Laterality Date  . No past surgeries      Family History  Problem Relation Age of Onset  . Hyperlipidemia Mother     Social History  Substance Use Topics  . Smoking status: Former Smoker -- 0.30 packs/day    Types: Cigarettes  . Smokeless tobacco: None  . Alcohol Use: Yes     Comment: occ    Allergies: No Known Allergies  Prescriptions prior to admission  Medication Sig Dispense Refill Last Dose  . Prenatal Vit-Fe Fumarate-FA (PRENATAL MULTIVITAMIN) TABS tablet Take 1 tablet by mouth daily at 12 noon.   05/04/2015 at Unknown time  . [DISCONTINUED] fluconazole (DIFLUCAN) 150 MG tablet Take 1 tablet (150 mg total) by mouth once. May repeat in 48 hours if symptoms persist (Patient not taking: Reported on 05/04/2015) 2 tablet 0 Completed Course at Unknown time  . [DISCONTINUED] metroNIDAZOLE (FLAGYL) 500 MG tablet Take 1 tablet (500 mg total) by mouth 2 (two) times daily. One po bid x 7 days (Patient not taking: Reported on 05/04/2015) 14 tablet 0 Completed Course at Unknown time  . [DISCONTINUED] metroNIDAZOLE (FLAGYL) 500 MG tablet Take 1 tablet (500 mg total) by mouth 2 (two) times daily. (Patient not taking: Reported on 05/04/2015) 14 tablet 0 Completed Course at Unknown time    Review of Systems  Constitutional: Negative for fever.  Gastrointestinal: Positive for abdominal pain.  Genitourinary:       Vaginal bleeding  All other systems reviewed and are negative.  Physical Exam   Blood pressure 116/55, pulse 69,  temperature 98.4 F (36.9 C), temperature source Oral, resp. rate 18, last menstrual period 02/22/2015.  Physical Exam  Nursing note and vitals reviewed. Constitutional: She is oriented to person, place, and time. She appears well-developed and well-nourished. No distress.  HENT:  Head: Normocephalic and atraumatic.  Neck: Normal range of motion.  Cardiovascular: Normal rate.   Respiratory: Effort normal. No respiratory distress.  GI: Soft.  Musculoskeletal: Normal range of motion.  Neurological: She is alert and oriented to person, place, and time.  Skin: Skin is warm and dry.  Psychiatric: She has a normal mood and affect. Her behavior is normal. Judgment and thought content normal.   Results for orders placed or performed during the hospital encounter of 05/04/15 (from the past 24 hour(s))  Urinalysis, Routine w reflex microscopic (not at [facility])     Status: Abnormal   Collection Time: 05/04/15  9:35 AM  Result Value Ref Range   Color, Urine YELLOW YELLOW   APPearance CLEAR CLEAR   Specific Gravity, Urine >1.030 (H) 1.005 - 1.030   pH 6.0 5.0 - 8.0   Glucose, UA NEGATIVE NEGATIVE mg/dL   Hgb urine dipstick NEGATIVE NEGATIVE   Bilirubin Urine NEGATIVE NEGATIVE   Ketones, ur NEGATIVE NEGATIVE mg/dL   Protein, ur NEGATIVE NEGATIVE mg/dL   Nitrite NEGATIVE NEGATIVE   Leukocytes, UA NEGATIVE NEGATIVE  Pregnancy, urine POC     Status: Abnormal   Collection  Time: 05/04/15  9:40 AM  Result Value Ref Range   Preg Test, Ur POSITIVE (A) NEGATIVE  CBC     Status: None   Collection Time: 05/04/15 11:50 AM  Result Value Ref Range   WBC 8.1 4.0 - 10.5 K/uL   RBC 4.23 3.87 - 5.11 MIL/uL   Hemoglobin 12.4 12.0 - 15.0 g/dL   HCT 74.2 59.5 - 63.8 %   MCV 86.3 78.0 - 100.0 fL   MCH 29.3 26.0 - 34.0 pg   MCHC 34.0 30.0 - 36.0 g/dL   RDW 75.6 43.3 - 29.5 %   Platelets 264 150 - 400 K/uL  hCG, quantitative, pregnancy     Status: Abnormal   Collection Time: 05/04/15 11:50 AM  Result Value  Ref Range   hCG, Beta Chain, Quant, S 3660 (H) <5 mIU/mL  ABO/Rh     Status: None   Collection Time: 05/04/15 11:50 AM  Result Value Ref Range   ABO/RH(D) O NEG   US Ob Comp Less 14 Wks  05/04/2015  EXAM: OBSTETRIC <14 WK [place] AND TRANSVAGINAL OB US DOPPLER ULTRASOUND OF OVARIES TECHNIQUE: Both transabdominal and transvaginal ultrasound examinations were performed for complete evaluation of the gestation as well as the maternal uterus, adnexal regions, and pelvic cul-de-sac. Transvaginal technique was performed to assess early pregnancy. Color and duplex Doppler ultrasound was utilized to evaluate blood flow to the ovaries. COMPARISON:  None. FINDINGS: Intrauterine gestational sac: Visualized/normal in shape. Yolk sac:  Not visualized. Embryo:  An embryo is identified. Cardiac Activity: Not visualized. Heart Rate: 0 bpm MSD:   mm    w     d CRL:   2.35 cm 9 w 1 d                  [place] [facility]: December 06, 2015 Subchorionic hemorrhage:  None visualized. Maternal uterus/adnexae: Unremarkable. Probable corpus luteum cyst on the left. Pulsed Doppler evaluation of both ovaries demonstrates blood flow in both ovaries. Waveforms were not obtained. IMPRESSION: An intrauterine pregnancy is identified. However, no fetal heart tones/motion are identified. The findings suggest fetal demise. Electronically Signed   By: Gerome Sam III M.D   On: 05/04/2015 13:12   US Ob Transvaginal  05/04/2015  EXAM: OBSTETRIC <14 WK [place] AND TRANSVAGINAL OB US DOPPLER ULTRASOUND OF OVARIES TECHNIQUE: Both transabdominal and transvaginal ultrasound examinations were performed for complete evaluation of the gestation as well as the maternal uterus, adnexal regions, and pelvic cul-de-sac. Transvaginal technique was performed to assess early pregnancy. Color and duplex Doppler ultrasound was utilized to evaluate blood flow to the ovaries. COMPARISON:  None. FINDINGS: Intrauterine gestational sac: Visualized/normal in shape. Yolk sac:  Not  visualized. Embryo:  An embryo is identified. Cardiac Activity: Not visualized. Heart Rate: 0 bpm MSD:   mm    w     d CRL:   2.35 cm 9 w 1 d                  [place] [facility]: December 06, 2015 Subchorionic hemorrhage:  None visualized. Maternal uterus/adnexae: Unremarkable. Probable corpus luteum cyst on the left. Pulsed Doppler evaluation of both ovaries demonstrates blood flow in both ovaries. Waveforms were not obtained. IMPRESSION: An intrauterine pregnancy is identified. However, no fetal heart tones/motion are identified. The findings suggest fetal demise. Electronically Signed   By: Gerome Sam III M.D   On: 05/04/2015 13:12   MAU Course  Procedures  MDM Pt has missed AB. Options of expectant  management or D&C given. Consulted with Dr. Shawnie Pons. Message sent to G. presnell to schedule surgery this week. Pt given bleeding precautions. Pt will receive Rhogam before discharge.  Assessment and Plan  Missed AB Schedule D&C Bleeding Precautions Discharge  Miranda Drake 05/04/2015, 1:45 PM

## 2015-05-04 NOTE — MAU Note (Signed)
Pos HPT a month ago, also again last night.  C/O lower abd cramping since last week, had some bleeding last night - passed small clot this a.m.

## 2015-05-05 ENCOUNTER — Inpatient Hospital Stay (HOSPITAL_COMMUNITY)
Admission: AD | Admit: 2015-05-05 | Discharge: 2015-05-05 | Disposition: A | Discharge status: Home / Self Care | Payer: Medicaid Other | Source: Ambulatory Visit | Attending: Family Medicine | Admitting: Family Medicine

## 2015-05-05 ENCOUNTER — Encounter (HOSPITAL_COMMUNITY): Payer: Self-pay

## 2015-05-05 ENCOUNTER — Inpatient Hospital Stay (HOSPITAL_COMMUNITY): Payer: Medicaid Other

## 2015-05-05 DIAGNOSIS — O209 Hemorrhage in early pregnancy, unspecified: Secondary | ICD-10-CM | POA: Diagnosis present

## 2015-05-05 DIAGNOSIS — O034 Incomplete spontaneous abortion without complication: Secondary | ICD-10-CM | POA: Diagnosis not present

## 2015-05-05 DIAGNOSIS — Z3A00 Weeks of gestation of pregnancy not specified: Secondary | ICD-10-CM | POA: Insufficient documentation

## 2015-05-05 LAB — RH IG WORKUP (INCLUDES ABO/RH)
ABO/RH(D): O NEG
Antibody Screen: NEGATIVE
Gestational Age(Wks): 9
Unit division: 0

## 2015-05-05 LAB — CBC
HCT: 31.4 % — ABNORMAL LOW (ref 36.0–46.0)
Hemoglobin: 10.8 g/dL — ABNORMAL LOW (ref 12.0–15.0)
MCH: 29.3 pg (ref 26.0–34.0)
MCHC: 34.4 g/dL (ref 30.0–36.0)
MCV: 85.3 fL (ref 78.0–100.0)
PLATELETS: 216 10*3/uL (ref 150–400)
RBC: 3.68 MIL/uL — AB (ref 3.87–5.11)
RDW: 13.4 % (ref 11.5–15.5)
WBC: 9.5 10*3/uL (ref 4.0–10.5)

## 2015-05-05 MED ORDER — MISOPROSTOL 200 MCG PO TABS
800.0000 ug | ORAL_TABLET | Freq: Once | ORAL | Status: AC
  Administered 2015-05-05: 800 ug via ORAL
  Filled 2015-05-05: qty 4

## 2015-05-05 MED ORDER — HYDROMORPHONE HCL 1 MG/ML IJ SOLN
1.0000 mg | Freq: Once | INTRAMUSCULAR | Status: AC
  Administered 2015-05-05: 1 mg via INTRAVENOUS
  Filled 2015-05-05: qty 1

## 2015-05-05 MED ORDER — MISOPROSTOL 200 MCG PO TABS: 800.0000 ug | ORAL_TABLET | Freq: Once | ORAL | Status: DC

## 2015-05-05 MED ORDER — KETOROLAC TROMETHAMINE 30 MG/ML IJ SOLN
30.0000 mg | Freq: Once | INTRAMUSCULAR | Status: AC
  Administered 2015-05-05: 30 mg via INTRAVENOUS
  Filled 2015-05-05: qty 1

## 2015-05-05 MED ORDER — LACTATED RINGERS IV BOLUS (SEPSIS)
1000.0000 mL | Freq: Once | INTRAVENOUS | Status: AC
  Administered 2015-05-05: 1000 mL via INTRAVENOUS

## 2015-05-05 NOTE — Discharge Instructions (Signed)
Miscarriage  A miscarriage is the sudden loss of an unborn baby (fetus) before the 20th week of pregnancy. Most miscarriages happen in the first 3 months of pregnancy. Sometimes, it happens before a woman even knows she is pregnant. A miscarriage is also called a "spontaneous miscarriage" or "early pregnancy loss." Having a miscarriage can be an emotional experience. Talk with your caregiver about any questions you may have about miscarrying, the grieving process, and your future pregnancy plans.  CAUSES    Problems with the fetal chromosomes that make it impossible for the baby to develop normally. Problems with the baby's genes or chromosomes are most often the result of errors that occur, by chance, as the embryo divides and grows. The problems are not inherited from the parents.   Infection of the cervix or uterus.    Hormone problems.    Problems with the cervix, such as having an incompetent cervix. This is when the tissue in the cervix is not strong enough to hold the pregnancy.    Problems with the uterus, such as an abnormally shaped uterus, uterine fibroids, or congenital abnormalities.    Certain medical conditions.    Smoking, drinking alcohol, or taking illegal drugs.    Trauma.   Often, the cause of a miscarriage is unknown.   SYMPTOMS    Vaginal bleeding or spotting, with or without cramps or pain.   Pain or cramping in the abdomen or lower back.   Passing fluid, tissue, or blood clots from the vagina.  DIAGNOSIS   Your caregiver will perform a physical exam. You may also have an ultrasound to confirm the miscarriage. Blood or urine tests may also be ordered.  TREATMENT    Sometimes, treatment is not necessary if you naturally pass all the fetal tissue that was in the uterus. If some of the fetus or placenta remains in the body (incomplete miscarriage), tissue left behind may become infected and must be removed. Usually, a dilation and curettage (D and C) procedure is performed.  During a D and C procedure, the cervix is widened (dilated) and any remaining fetal or placental tissue is gently removed from the uterus.   Antibiotic medicines are prescribed if there is an infection. Other medicines may be given to reduce the size of the uterus (contract) if there is a lot of bleeding.   If you have Rh negative blood and your baby was Rh positive, you will need a Rh immunoglobulin shot. This shot will protect any future baby from having Rh blood problems in future pregnancies.  HOME CARE INSTRUCTIONS    Your caregiver may order bed rest or may allow you to continue light activity. Resume activity as directed by your caregiver.   Have someone help with home and family responsibilities during this time.    Keep track of the number of sanitary pads you use each day and how soaked (saturated) they are. Write down this information.    Do not use tampons. Do not douche or have sexual intercourse until approved by your caregiver.    Only take over-the-counter or prescription medicines for pain or discomfort as directed by your caregiver.    Do not take aspirin. Aspirin can cause bleeding.    Keep all follow-up appointments with your caregiver.    If you or your partner have problems with grieving, talk to your caregiver or seek counseling to help cope with the pregnancy loss. Allow enough time to grieve before trying to get pregnant again.     SEEK IMMEDIATE MEDICAL CARE IF:    You have severe cramps or pain in your back or abdomen.   You have a fever.   You pass large blood clots (walnut-sized or larger) ortissue from your vagina. Save any tissue for your caregiver to inspect.    Your bleeding increases.    You have a thick, bad-smelling vaginal discharge.   You become lightheaded, weak, or you faint.    You have chills.   MAKE SURE YOU:   Understand these instructions.   Will watch your condition.   Will get help right away if you are not doing well or get worse.     This  information is not intended to replace advice given to you by your health care provider. Make sure you discuss any questions you have with your health care provider.     Document Released: 09/27/2000 Document Revised: 07/29/2012 Document Reviewed: 05/23/2011  Elsevier Interactive Patient Education 2016 [employer]

## 2015-05-05 NOTE — MAU Provider Note (Signed)
Chief Complaint: Vaginal Bleeding   None     SUBJECTIVE HPI: Miranda Drake is a 25 y.o. G2P1001 at [redacted]w[redacted]d by LMP who presents to maternity admissions reporting onset of heavy bleeding yesterday, with large clots and what may have been tissue, and increased abdominal cramping.  She was seen 1/17 in MAU with vaginal bleeding and was diagnosed with missed ab at [redacted]w[redacted]d.  She has scheduled D&E on 1/19.  She has not taken anything for pain or bleeding.  It is unaffected by activity or position.   She denies vaginal itching/burning, urinary symptoms, h/a, dizziness, n/v, or fever/chills.     HPI  Past Medical History  Diagnosis Date  . Medical history non-contributory    Past Surgical History  Procedure Laterality Date  . No past surgeries     Social History   Social History  . Marital Status: Single    Spouse Name: N/A  . Number of Children: N/A  . Years of Education: N/A   Occupational History  . Not on file.   Social History Main Topics  . Smoking status: Former Smoker -- 0.30 packs/day    Types: Cigarettes  . Smokeless tobacco: Not on file  . Alcohol Use: Yes     Comment: occ  . Drug Use: Yes    Special: Marijuana     Comment: none since 6wks  . Sexual Activity: Yes     Comment: pregnant   Other Topics Concern  . Not on file   Social History Narrative   No current facility-administered medications on file prior to encounter.   Current Outpatient Prescriptions on File Prior to Encounter  Medication Sig Dispense Refill  . Prenatal Vit-Fe Fumarate-FA (PRENATAL MULTIVITAMIN) TABS tablet Take 1 tablet by mouth daily at 12 noon.     No Known Allergies  ROS:  Review of Systems  Constitutional: Negative for fever, chills and fatigue.  Respiratory: Negative for shortness of breath.   Cardiovascular: Negative for chest pain.  Gastrointestinal: Positive for abdominal pain.  Genitourinary: Positive for vaginal bleeding and pelvic pain. Negative for dysuria, flank pain,  vaginal discharge, difficulty urinating and vaginal pain.  Neurological: Negative for dizziness and headaches.  Psychiatric/Behavioral: Negative.      I have reviewed patient's Past Medical Hx, Surgical Hx, Family Hx, Social Hx, medications and allergies.   Physical Exam   Patient Vitals for the past 24 hrs:  BP Temp Temp src Pulse Resp  05/05/15 0253 111/55 mmHg - - 80 -  05/05/15 0135 (!) 106/47 mmHg - - 77 -  05/05/15 0117 (!) 91/54 mmHg 98.4 F (36.9 C) Oral 77 20   Constitutional: Well-developed, well-nourished female in no acute distress.  Cardiovascular: normal rate Respiratory: normal effort GI: Abd soft, non-tender. Pos BS x 4 MS: Extremities nontender, no edema, normal ROM Neurologic: Alert and oriented x 4.  GU: Neg CVAT.  PELVIC EXAM: Unable to visualize cervix related to amount of bleeding, multiple large cotton swabs used to remove blood and clots from vagina but more bleeding continued, several large clots removed during exam but no evidence of POCs on exam, vaginal walls and external genitalia normal Bimanual exam: Cervix 0/long/high, firm, anterior, neg CMT, uterus nontender, slightly enlarged, adnexa without tenderness, enlargement, or mass   LAB RESULTS Results for orders placed or performed during the hospital encounter of 05/05/15 (from the past 24 hour(s))  CBC     Status: Abnormal   Collection Time: 05/05/15  1:35 AM  Result Value Ref Range  WBC 9.5 4.0 - 10.5 K/uL   RBC 3.68 (L) 3.87 - 5.11 MIL/uL   Hemoglobin 10.8 (L) 12.0 - 15.0 g/dL   HCT 16.1 (L) 09.6 - 04.5 %   MCV 85.3 78.0 - 100.0 fL   MCH 29.3 26.0 - 34.0 pg   MCHC 34.4 30.0 - 36.0 g/dL   RDW 40.9 81.1 - 91.4 %   Platelets 216 150 - 400 K/uL    --/--/O NEG, O NEG (01/17 1150)  IMAGING US Ob Comp Less 14 Wks  05/04/2015  EXAM: OBSTETRIC <14 WK [place] AND TRANSVAGINAL OB US DOPPLER ULTRASOUND OF OVARIES TECHNIQUE: Both transabdominal and transvaginal ultrasound examinations were performed  for complete evaluation of the gestation as well as the maternal uterus, adnexal regions, and pelvic cul-de-sac. Transvaginal technique was performed to assess early pregnancy. Color and duplex Doppler ultrasound was utilized to evaluate blood flow to the ovaries. COMPARISON:  None. FINDINGS: Intrauterine gestational sac: Visualized/normal in shape. Yolk sac:  Not visualized. Embryo:  An embryo is identified. Cardiac Activity: Not visualized. Heart Rate: 0 bpm MSD:   mm    w     d CRL:   2.35 cm 9 w 1 d                  [place] [facility]: December 06, 2015 Subchorionic hemorrhage:  None visualized. Maternal uterus/adnexae: Unremarkable. Probable corpus luteum cyst on the left. Pulsed Doppler evaluation of both ovaries demonstrates blood flow in both ovaries. Waveforms were not obtained. IMPRESSION: An intrauterine pregnancy is identified. However, no fetal heart tones/motion are identified. The findings suggest fetal demise. Electronically Signed   By: Gerome Sam III M.D   On: 05/04/2015 13:12   US Ob Transvaginal  05/05/2015  CLINICAL DATA:  25 year old female with prostate pregnancy test with increased vaginal bleeding and passing tissue. EXAM: TRANSVAGINAL OB ULTRASOUND TECHNIQUE: Transvaginal ultrasound was performed for complete evaluation of the gestation as well as the maternal uterus, adnexal regions, and pelvic cul-de-sac. COMPARISON:  Ultrasound dated 04/24/2015 FINDINGS: The uterus is anteverted. The previously seen intrauterine pregnancy is not visualized and current study compatible with miscarriage. The endometrium is thickened and heterogeneous measuring 1.9 cm in thickness. There are areas of increased vascularity within the endometrium. Retained products of conception is not excluded. Clinical correlation and follow-up recommended. The maternal ovaries appear unremarkable. The right ovary measures 4.9 x 1.8 by 1.5 cm and the left ovary measures 4.6 x 2.0 x 2.6 cm. No significant free fluid identified  within the pelvis. IMPRESSION: Interval loss of the previously seen intrauterine pregnancy compatible with miscarriage. Heterogeneous and thickened endometrium with areas of vascularity. Retained products of conception is not excluded. Clinical correlation and follow-up recommended. These results were called by telephone at the time of interpretation on 05/05/2015 at 2:58 am to nurse midwife Natonya Finstad LEFTWICH-KIRBY , who verbally acknowledged these results. Electronically Signed   By: Elgie Collard M.D.   On: 05/05/2015 02:59   US Ob Transvaginal  05/04/2015  EXAM: OBSTETRIC <14 WK [place] AND TRANSVAGINAL OB US DOPPLER ULTRASOUND OF OVARIES TECHNIQUE: Both transabdominal and transvaginal ultrasound examinations were performed for complete evaluation of the gestation as well as the maternal uterus, adnexal regions, and pelvic cul-de-sac. Transvaginal technique was performed to assess early pregnancy. Color and duplex Doppler ultrasound was utilized to evaluate blood flow to the ovaries. COMPARISON:  None. FINDINGS: Intrauterine gestational sac: Visualized/normal in shape. Yolk sac:  Not visualized. Embryo:  An embryo is identified. Cardiac Activity: Not visualized.  Heart Rate: 0 bpm MSD:   mm    w     d CRL:   2.35 cm 9 w 1 d                  [place] [facility]: December 06, 2015 Subchorionic hemorrhage:  None visualized. Maternal uterus/adnexae: Unremarkable. Probable corpus luteum cyst on the left. Pulsed Doppler evaluation of both ovaries demonstrates blood flow in both ovaries. Waveforms were not obtained. IMPRESSION: An intrauterine pregnancy is identified. However, no fetal heart tones/motion are identified. The findings suggest fetal demise. Electronically Signed   By: Gerome Sam III M.D   On: 05/04/2015 13:12    MAU Management/MDM: Ordered labs and reviewed results.  Hgb down from 12.4 yesterday to 10.8 today. VS stable, pt without symptoms of anemia.  Consult Dr Shawnie Pons.  Cytotec 800 mcg PO x 1 dose in MAU.   Observation over 2 hours with decreased bleeding noted.  Pt remained stable, VS stable while in MAU.  D/C home with Rx for second dose of Cytotec if pt bleeding increased again.  Pt to follow up in [facility] in 2 weeks, message sent to set up appt.  Treatments in MAU included LR x 1000 ml, Toradol 60 mg IM, and Cytotec 800 mcg PO. Pt stable at time of discharge.  ASSESSMENT 1. Incomplete spontaneous abortion   2. Vaginal bleeding in pregnancy, first trimester     PLAN Discharge home with bleeding precautions  Rx for second dose of Cytotec 800 mcg PO to use PRN, increased bleeding Ibuprofen 600 mg Q 6 hours PRN F/U in clinic in 2 weeks, return to MAU as needed for emergencies    Medication List    TAKE these medications        misoprostol 200 MCG tablet  Commonly known as:  CYTOTEC  Take 4 tablets (800 mcg total) by mouth once.     prenatal multivitamin Tabs tablet  Take 1 tablet by mouth daily at 12 noon.       Follow-up Information    Follow up with [facility].   Specialty:  Obstetrics and Gynecology   Why:  The clinic will call you with appointment in 2 weeks, Return to MAU as needed for emergencies   Contact information:   [address] [place] [address] [phone]      Sharen Counter Certified Nurse-Midwife 05/05/2015  3:08 AM

## 2015-05-05 NOTE — MAU Note (Signed)
Pt return from u/s-states she feels okay. No complaints of feeling faint or dizzy

## 2015-05-05 NOTE — MAU Note (Signed)
Pt tolerated ambulating to bathroom. Small amount of blood noted on pad. Bonna Gains CNM notified of patient condition and patient may be discharged home.

## 2015-05-05 NOTE — MAU Note (Signed)
Pt resting comfortably now, denies any pain. Small amount of blood noted on pad.

## 2015-05-05 NOTE — MAU Note (Signed)
PT SAYS  SHE  STARTED  HEAVY  BLEEDING  AT -  AND THINKS  SHE  PASSED    TISSUE..   IS  STILL CRAMPING   .  IN TRIAGE  -  PAD ON       SMALL AMT   RED  BLEEDING.      IS  SCH  ON 1-19   FOR  D/C.     NO MEDS   FOR  CRAMPS.

## 2015-05-05 NOTE — MAU Note (Signed)
Small amount of blood noted on pad. Pt states she has no pain at this time.

## 2015-05-05 NOTE — MAU Note (Signed)
Pad changed after cytotec given. Moderate amount noted on pad. Pt is asymptomatic at this time. Will continue to monitor over the next 2 hours per L. Leftwich Craige Cotta CNM

## 2015-05-06 ENCOUNTER — Ambulatory Visit: Admit: 2015-05-06 | Payer: Medicaid Other | Admitting: Obstetrics & Gynecology

## 2015-05-06 SURGERY — DILATION AND EVACUATION, UTERUS
Anesthesia: Choice | Site: Vagina

## 2015-05-19 ENCOUNTER — Encounter: Payer: Self-pay | Admitting: Family Medicine

## 2015-05-19 ENCOUNTER — Ambulatory Visit (INDEPENDENT_AMBULATORY_CARE_PROVIDER_SITE_OTHER): Payer: Medicaid Other | Admitting: Family Medicine

## 2015-05-19 VITALS — BP 105/56 | HR 79 | Temp 98.3°F | Ht 62.0 in | Wt 129.2 lb

## 2015-05-19 DIAGNOSIS — O034 Incomplete spontaneous abortion without complication: Secondary | ICD-10-CM | POA: Insufficient documentation

## 2015-05-19 DIAGNOSIS — O0380 Unspecified complication following complete or unspecified spontaneous abortion: Secondary | ICD-10-CM | POA: Diagnosis not present

## 2015-05-19 DIAGNOSIS — O039 Complete or unspecified spontaneous abortion without complication: Secondary | ICD-10-CM

## 2015-05-19 NOTE — Progress Notes (Signed)
Patient ID: Miranda Drake, female   DOB: November 03, 1990, 25 y.o.   MRN: [id number]   Miranda Drake  is a 25 y.o.  Presenting for MAU follow up. Had [place] that confrimed missed AB with likely retained POC. She was given cytotec. Reports heavy bleeding for 2-3 days then has had lessening bleeding with now only light spotting. She denies dizziness, lightheadedness.     OB History  Gravida Para Term Preterm AB SAB TAB Ectopic Multiple Living  # Outcome Date GA Lbr Len/2nd Weight Sex Delivery Anes PTL Lv  2 Current           1 Term 01/31/05    Heide Scales      Past Medical History  Diagnosis Date  . Medical history non-contributory     BP 105/56 mmHg  Pulse 79  Temp(Src) 98.3 F (36.8 C) (Oral)  Ht  (1.575 m)  Wt 129 lb 3.2 oz (58.605 kg)  BMI 23.63 kg/m2  LMP 02/22/2015 (Approximate)  CONSTITUTIONAL: Well-developed, well-nourished female in no acute distress.  ENT: External right and left ear normal.  EYES: EOM intact, conjunctivae normal.  MUSCULOSKELETAL: Normal range of motion.  CARDIOVASCULAR: Regular heart rate RESPIRATORY: Normal effort NEUROLOGICAL: Alert and oriented to person, place, and time.  SKIN: Skin is warm and dry. No rash noted. Not diaphoretic. No erythema. No pallor. PSYCH: Normal mood and affect. Normal behavior. Normal judgment and thought content.  No results found for this or any previous visit (from the past 24 hour(s)).  A: Completed AB  P: Discussed starting PNV Dicussed return of fertility and trying for pregnancy again Reviewed calling clinic to get scheduled for [place] when she has a positive pregnancy test.   >50% of this 30 minute appt was spend in direct counseling regarding miscarriage management and pregnancy planning.   Federico Flake, MD 05/19/2015 2:20 PM

## 2015-05-19 NOTE — Progress Notes (Signed)
Patient ID: Miranda Drake, female   DOB: 1991/03/06, 25 y.o.   MRN: [id number]

## 2016-03-30 ENCOUNTER — Encounter: Payer: Self-pay | Admitting: *Deleted

## 2016-03-30 ENCOUNTER — Ambulatory Visit (INDEPENDENT_AMBULATORY_CARE_PROVIDER_SITE_OTHER): Payer: Medicaid Other | Admitting: *Deleted

## 2016-03-30 DIAGNOSIS — Z3201 Encounter for pregnancy test, result positive: Secondary | ICD-10-CM

## 2016-03-30 LAB — POCT PREGNANCY, URINE: Preg Test, Ur: POSITIVE — AB

## 2016-03-30 NOTE — Progress Notes (Signed)
Pt in for pregnancy test. Results are positive. Her last period was around 01/21/16.Pt not taking any medications. Pregnancy verification letter given to patient.

## 2016-05-08 ENCOUNTER — Ambulatory Visit (INDEPENDENT_AMBULATORY_CARE_PROVIDER_SITE_OTHER): Payer: Self-pay | Admitting: Advanced Practice Midwife

## 2016-05-08 ENCOUNTER — Encounter: Payer: Self-pay | Admitting: Advanced Practice Midwife

## 2016-05-08 ENCOUNTER — Other Ambulatory Visit (HOSPITAL_COMMUNITY)
Admission: RE | Admit: 2016-05-08 | Discharge: 2016-05-08 | Disposition: A | Discharge status: Home / Self Care | Payer: Medicaid Other | Source: Ambulatory Visit | Attending: Advanced Practice Midwife | Admitting: Advanced Practice Midwife

## 2016-05-08 VITALS — BP 118/64 | HR 84 | Wt 139.7 lb

## 2016-05-08 DIAGNOSIS — Z3482 Encounter for supervision of other normal pregnancy, second trimester: Secondary | ICD-10-CM

## 2016-05-08 DIAGNOSIS — Z3400 Encounter for supervision of normal first pregnancy, unspecified trimester: Secondary | ICD-10-CM

## 2016-05-08 DIAGNOSIS — Z113 Encounter for screening for infections with a predominantly sexual mode of transmission: Secondary | ICD-10-CM | POA: Insufficient documentation

## 2016-05-08 DIAGNOSIS — O26899 Other specified pregnancy related conditions, unspecified trimester: Secondary | ICD-10-CM

## 2016-05-08 DIAGNOSIS — Z6791 Unspecified blood type, Rh negative: Secondary | ICD-10-CM

## 2016-05-08 DIAGNOSIS — E049 Nontoxic goiter, unspecified: Secondary | ICD-10-CM | POA: Insufficient documentation

## 2016-05-08 DIAGNOSIS — Z01419 Encounter for gynecological examination (general) (routine) without abnormal findings: Secondary | ICD-10-CM | POA: Diagnosis not present

## 2016-05-08 DIAGNOSIS — Z3490 Encounter for supervision of normal pregnancy, unspecified, unspecified trimester: Secondary | ICD-10-CM | POA: Insufficient documentation

## 2016-05-08 LAB — TSH: TSH: 2.49 mIU/L

## 2016-05-08 NOTE — Progress Notes (Signed)
  Subjective:    Miranda DumasKasaundra Mezquita is a G3P1011 2174w3d being seen today for her first obstetrical visit.  Her obstetrical history is significant for late to care and unsure dates Patient does intend to breast feed. Pregnancy history fully reviewed.  Patient reports no complaints.  Vitals:   05/08/16 1251  BP: 118/64  Pulse: 84  Weight: 139 lb 11.2 oz (63.4 kg)    HISTORY: OB History  Gravida Para Term Preterm AB Living  3 1 1   1 1   SAB TAB Ectopic Multiple Live Births  1       1    # Outcome Date GA Lbr Len/2nd Weight Sex Delivery Anes PTL Lv  3 Current           2 Term 01/31/05    M Vag-Spont   LIV  1 SAB              Past Medical History:  Diagnosis Date  . Medical history non-contributory    Past Surgical History:  Procedure Laterality Date  . NO PAST SURGERIES     Family History  Problem Relation Age of Onset  . Hyperlipidemia Mother      Exam    Uterus:     Pelvic Exam:    Perineum: No Hemorrhoids, Normal Perineum   Vulva: Bartholin's, Urethra, Skene's normal   Vagina:  normal discharge   pH:    Cervix: multiparous appearance   Adnexa: no mass, fullness, tenderness   Bony Pelvis: gynecoid and proven to 7+11  System: Breast:  normal appearance, no masses or tenderness   Skin: normal coloration and turgor, no rashes    Neurologic: oriented, grossly non-focal   Extremities: normal strength, tone, and muscle mass   HEENT neck supple with midline trachea   Mouth/Teeth mucous membranes moist, pharynx normal without lesions   Neck supple and no masses but thyroid slightly enlarged bilaterally   Cardiovascular: regular rate and rhythm   Respiratory:  appears well, vitals normal, no respiratory distress, acyanotic, normal RR, ear and throat exam is normal, neck free of mass or lymphadenopathy, chest clear, no wheezing, crepitations, rhonchi, normal symmetric air entry   Abdomen: soft, non-tender; bowel sounds normal; no masses,  no organomegaly   Urinary:  urethral meatus normal      Assessment:    Pregnancy: [id number] Patient Active Problem List   Diagnosis Date Noted  . Supervision of normal pregnancy 05/08/2016  . Rh negative state in antepartum period 05/08/2016        Plan:     Initial labs drawn. Prenatal vitamins. Problem list reviewed and updated. Genetic Screening discussed Quad Screen: requested.  Ultrasound discussed; fetal survey: ordered.  Follow up in 4 weeks. 50% of 30 min visit spent on counseling and coordination of care.  Routines reviewed Will add TSH   Wynelle BourgeoisMarie Sheng Pritz 05/08/2016

## 2016-05-08 NOTE — Patient Instructions (Signed)
Second Trimester of Pregnancy The second trimester is from week 13 through week 28, month 4 through 6. This is often the time in pregnancy that you feel your best. Often times, morning sickness has lessened or quit. You may have more energy, and you may get hungry more often. Your unborn baby (fetus) is growing rapidly. At the end of the sixth month, he or she is about 9 inches long and weighs about 1 pounds. You will likely feel the baby move (quickening) between 18 and 20 weeks of pregnancy. Follow these instructions at home:  Avoid all smoking, herbs, and alcohol. Avoid drugs not approved by your doctor.  Do not use any tobacco products, including cigarettes, chewing tobacco, and electronic cigarettes. If you need help quitting, ask your doctor. You may get counseling or other support to help you quit.  Only take medicine as told by your doctor. Some medicines are safe and some are not during pregnancy.  Exercise only as told by your doctor. Stop exercising if you start having cramps.  Eat regular, healthy meals.  Wear a good support bra if your breasts are tender.  Do not use hot tubs, steam rooms, or saunas.  Wear your seat belt when driving.  Avoid raw meat, uncooked cheese, and liter boxes and soil used by cats.  Take your prenatal vitamins.  Take 1500-2000 milligrams of calcium daily starting at the 20th week of pregnancy until you deliver your baby.  Try taking medicine that helps you poop (stool softener) as needed, and if your doctor approves. Eat more fiber by eating fresh fruit, vegetables, and whole grains. Drink enough fluids to keep your pee (urine) clear or pale yellow.  Take warm water baths (sitz baths) to soothe pain or discomfort caused by hemorrhoids. Use hemorrhoid cream if your doctor approves.  If you have puffy, bulging veins (varicose veins), wear support hose. Raise (elevate) your feet for 15 minutes, 3-4 times a day. Limit salt in your diet.  Avoid heavy  lifting, wear low heals, and sit up straight.  Rest with your legs raised if you have leg cramps or low back pain.  Visit your dentist if you have not gone during your pregnancy. Use a soft toothbrush to brush your teeth. Be gentle when you floss.  You can have sex (intercourse) unless your doctor tells you not to.  Go to your doctor visits. Get help if:  You feel dizzy.  You have mild cramps or pressure in your lower belly (abdomen).  You have a nagging pain in your belly area.  You continue to feel sick to your stomach (nauseous), throw up (vomit), or have watery poop (diarrhea).  You have bad smelling fluid coming from your vagina.  You have pain with peeing (urination). Get help right away if:  You have a fever.  You are leaking fluid from your vagina.  You have spotting or bleeding from your vagina.  You have severe belly cramping or pain.  You lose or gain weight rapidly.  You have trouble catching your breath and have chest pain.  You notice sudden or extreme puffiness (swelling) of your face, hands, ankles, feet, or legs.  You have not felt the baby move in over an hour.  You have severe headaches that do not go away with medicine.  You have vision changes. This information is not intended to replace advice given to you by your health care provider. Make sure you discuss any questions you have with your health care   provider. Document Released: 06/28/2009 Document Revised: 09/09/2015 Document Reviewed: 06/04/2012 Elsevier Interactive Patient Education  2017 [employer]  

## 2016-05-09 LAB — CULTURE, OB URINE: Organism ID, Bacteria: NO GROWTH

## 2016-05-10 LAB — PRENATAL PROFILE (SOLSTAS)
ANTIBODY SCREEN: NEGATIVE
BASOS PCT: 0 %
Basophils Absolute: 0 cells/uL (ref 0–200)
Eosinophils Absolute: 92 cells/uL (ref 15–500)
Eosinophils Relative: 1 %
HEMATOCRIT: 34.1 % — AB (ref 35.0–45.0)
HIV: NONREACTIVE
Hemoglobin: 11.4 g/dL — ABNORMAL LOW (ref 11.7–15.5)
Hepatitis B Surface Ag: NEGATIVE
LYMPHS PCT: 24 %
Lymphs Abs: 2208 cells/uL (ref 850–3900)
MCH: 28.6 pg (ref 27.0–33.0)
MCHC: 33.4 g/dL (ref 32.0–36.0)
MCV: 85.5 fL (ref 80.0–100.0)
MONOS PCT: 8 %
MPV: 8.8 fL (ref 7.5–12.5)
Monocytes Absolute: 736 cells/uL (ref 200–950)
NEUTROS PCT: 67 %
Neutro Abs: 6164 cells/uL (ref 1500–7800)
PLATELETS: 304 10*3/uL (ref 140–400)
RBC: 3.99 MIL/uL (ref 3.80–5.10)
RDW: 13.9 % (ref 11.0–15.0)
RH TYPE: NEGATIVE
Rubella: 3.78 Index — ABNORMAL HIGH (ref <0.90)
WBC: 9.2 10*3/uL (ref 3.8–10.8)

## 2016-05-11 ENCOUNTER — Encounter: Payer: Self-pay | Admitting: Advanced Practice Midwife

## 2016-05-11 DIAGNOSIS — F1210 Cannabis abuse, uncomplicated: Secondary | ICD-10-CM | POA: Insufficient documentation

## 2016-05-11 LAB — CYTOLOGY - PAP
Chlamydia: NEGATIVE
Diagnosis: NEGATIVE
NEISSERIA GONORRHEA: NEGATIVE

## 2016-05-11 LAB — PAIN MGMT, PROFILE 6 CONF W/O MM, U
6 Acetylmorphine: NEGATIVE ng/mL (ref <10)
AMPHETAMINES: NEGATIVE ng/mL (ref <500)
Alcohol Metabolites: NEGATIVE ng/mL (ref <500)
Barbiturates: NEGATIVE ng/mL (ref <300)
Benzodiazepines: NEGATIVE ng/mL (ref <100)
Cocaine Metabolite: NEGATIVE ng/mL (ref <150)
Creatinine: 143.5 mg/dL (ref >=20.0)
MARIJUANA METABOLITE: POSITIVE ng/mL — AB (ref <20)
Marijuana Metabolite: 9 ng/mL — ABNORMAL HIGH (ref <5)
Methadone Metabolite: NEGATIVE ng/mL (ref <100)
OPIATES: NEGATIVE ng/mL (ref <100)
OXIDANT: NEGATIVE ug/mL (ref <200)
Oxycodone: NEGATIVE ng/mL (ref <100)
PLEASE NOTE: 0
Phencyclidine: NEGATIVE ng/mL (ref <25)
pH: 7.25 (ref 4.5–9.0)

## 2016-05-26 ENCOUNTER — Other Ambulatory Visit: Payer: Self-pay | Admitting: Advanced Practice Midwife

## 2016-05-26 ENCOUNTER — Ambulatory Visit (HOSPITAL_COMMUNITY)
Admission: RE | Admit: 2016-05-26 | Discharge: 2016-05-26 | Disposition: A | Discharge status: Home / Self Care | Payer: Medicaid Other | Source: Ambulatory Visit | Attending: Advanced Practice Midwife | Admitting: Advanced Practice Midwife

## 2016-05-26 DIAGNOSIS — O0932 Supervision of pregnancy with insufficient antenatal care, second trimester: Secondary | ICD-10-CM | POA: Diagnosis not present

## 2016-05-26 DIAGNOSIS — Z3687 Encounter for antenatal screening for uncertain dates: Secondary | ICD-10-CM | POA: Diagnosis not present

## 2016-05-26 DIAGNOSIS — Z3689 Encounter for other specified antenatal screening: Secondary | ICD-10-CM

## 2016-05-26 DIAGNOSIS — Z3A18 18 weeks gestation of pregnancy: Secondary | ICD-10-CM | POA: Diagnosis not present

## 2016-05-26 DIAGNOSIS — Z3482 Encounter for supervision of other normal pregnancy, second trimester: Secondary | ICD-10-CM

## 2016-05-26 DIAGNOSIS — Z3492 Encounter for supervision of normal pregnancy, unspecified, second trimester: Secondary | ICD-10-CM

## 2016-06-06 ENCOUNTER — Ambulatory Visit (INDEPENDENT_AMBULATORY_CARE_PROVIDER_SITE_OTHER): Payer: Medicaid Other | Admitting: Student

## 2016-06-06 VITALS — BP 114/59 | HR 83 | Wt 146.3 lb

## 2016-06-06 DIAGNOSIS — Z3482 Encounter for supervision of other normal pregnancy, second trimester: Secondary | ICD-10-CM

## 2016-06-06 DIAGNOSIS — Z3402 Encounter for supervision of normal first pregnancy, second trimester: Secondary | ICD-10-CM

## 2016-06-06 MED ORDER — PRENATAL VITAMINS 0.8 MG PO TABS: 1.0000 | ORAL_TABLET | Freq: Every day | ORAL | 12 refills | Status: DC

## 2016-06-06 NOTE — Patient Instructions (Signed)
How a Baby Grows During Pregnancy Introduction Pregnancy begins when a female's sperm enters a female's egg (fertilization). This happens in one of the tubes (fallopian tubes) that connect the ovaries to the womb (uterus). The fertilized egg is called an embryo until it reaches 10 weeks. From 10 weeks until birth, it is called a fetus. The fertilized egg moves down the fallopian tube to the uterus. Then it implants into the lining of the uterus and begins to grow. The developing fetus receives oxygen and nutrients through the pregnant woman's bloodstream and the tissues that grow (placenta) to support the fetus. The placenta is the life support system for the fetus. It provides nutrition and removes waste. Learning as much as you can about your pregnancy and how your baby is developing can help you enjoy the experience. It can also make you aware of when there might be a problem and when to ask questions. How long does a typical pregnancy last? A pregnancy usually lasts 280 days, or about 40 weeks. Pregnancy is divided into three trimesters:  First trimester: 0-13 weeks.  Second trimester: 14-27 weeks.  Third trimester: 28-40 weeks. The day when your baby is considered ready to be born (full term) is your estimated date of delivery. How does my baby develop month by month? First month  The fertilized egg attaches to the inside of the uterus.  Some cells will form the placenta. Others will form the fetus.  The arms, legs, brain, spinal cord, lungs, and heart begin to develop.  At the end of the first month, the heart begins to beat. Second month  The bones, inner ear, eyelids, hands, and feet form.  The genitals develop.  By the end of 8 weeks, all major organs are developing. Third month  All of the internal organs are forming.  Teeth develop below the gums.  Bones and muscles begin to grow. The spine can flex.  The skin is transparent.  Fingernails and toenails begin to  form.  Arms and legs continue to grow longer, and hands and feet develop.  The fetus is about 3 in (7.6 cm) long. Fourth month  The placenta is completely formed.  The external sex organs, neck, outer ear, eyebrows, eyelids, and fingernails are formed.  The fetus can hear, swallow, and move its arms and legs.  The kidneys begin to produce urine.  The skin is covered with a white waxy coating (vernix) and very fine hair (lanugo). Fifth month  The fetus moves around more and can be felt for the first time (quickening).  The fetus starts to sleep and wake up and may begin to suck its finger.  The nails grow to the end of the fingers.  The organ in the digestive system that makes bile (gallbladder) functions and helps to digest the nutrients.  If your baby is a girl, eggs are present in her ovaries. If your baby is a boy, testicles start to move down into his scrotum. Sixth month  The lungs are formed, but the fetus is not yet able to breathe.  The eyes open. The brain continues to develop.  Your baby has fingerprints and toe prints. Your baby's hair grows thicker.  At the end of the second trimester, the fetus is about 9 in (22.9 cm) long. Seventh month  The fetus kicks and stretches.  The eyes are developed enough to sense changes in light.  The hands can make a grasping motion.  The fetus responds to sound. Eighth month    All organs and body systems are fully developed and functioning.  Bones harden and taste buds develop. The fetus may hiccup.  Certain areas of the brain are still developing. The skull remains soft. Ninth month  The fetus gains about  lb (0.23 kg) each week.  The lungs are fully developed.  Patterns of sleep develop.  The fetus's head typically moves into a head-down position (vertex) in the uterus to prepare for birth. If the buttocks move into a vertex position instead, the baby is breech.  The fetus weighs 6-9 lbs (2.72-4.08 kg) and is  19-20 in (48.26-50.8 cm) long. What can I do to have a healthy pregnancy and help my baby develop?  Eating and Drinking  Eat a healthy diet.  Talk with your health care provider to make sure that you are getting the nutrients that you and your baby need.  Visit www.choosemyplate.gov to learn about creating a healthy diet.  Gain a healthy amount of weight during pregnancy as advised by your health care provider. This is usually 25-35 pounds. You may need to:  Gain more if you were underweight before getting pregnant or if you are pregnant with more than one baby.  Gain less if you were overweight or obese when you got pregnant. Medicines and Vitamins  Take prenatal vitamins as directed by your health care provider. These include vitamins such as folic acid, iron, calcium, and vitamin D. They are important for healthy development.  Take medicines only as directed by your health care provider. Read labels and ask a pharmacist or your health care provider whether over-the-counter medicines, supplements, and prescription drugs are safe to take during pregnancy. Activities  Be physically active as advised by your health care provider. Ask your health care provider to recommend activities that are safe for you to do, such as walking or swimming.  Do not participate in strenuous or extreme sports.  Lifestyle  Do not drink alcohol.  Do not use any tobacco products, including cigarettes, chewing tobacco, or electronic cigarettes. If you need help quitting, ask your health care provider.  Do not use illegal drugs. Safety  Avoid exposure to mercury, lead, or other heavy metals. Ask your health care provider about common sources of these heavy metals.  Avoid listeria infection during pregnancy. Follow these precautions:  Do not eat soft cheeses or deli meats.  Do not eat hot dogs unless they have been warmed up to the point of steaming, such as in the microwave oven.  Do not drink  unpasteurized milk.  Avoid toxoplasmosis infection during pregnancy. Follow these precautions:  Do not change your cat's litter box, if you have a cat. Ask someone else to do this for you.  Wear gardening gloves while working in the yard. General Instructions  Keep all follow-up visits as directed by your health care provider. This is important. This includes prenatal care and screening tests.  Manage any chronic health conditions. Work closely with your health care provider to keep conditions, such as diabetes, under control. How do I know if my baby is developing well? At each prenatal visit, your health care provider will do several different tests to check on your health and keep track of your baby's development. These include:  Fundal height.  Your health care provider will measure your growing belly from top to bottom using a tape measure.  Your health care provider will also feel your belly to determine your baby's position.  Heartbeat.  An ultrasound in the first trimester   can confirm pregnancy and show a heartbeat, depending on how far along you are.  Your health care provider will check your baby's heart rate at every prenatal visit.  As you get closer to your delivery date, you may have regular fetal heart rate monitoring to make sure that your baby is not in distress.  Second trimester ultrasound.  This ultrasound checks your baby's development. It also indicates your baby's gender. What should I do if I have concerns about my baby's development? Always talk with your health care provider about any concerns that you may have. This information is not intended to replace advice given to you by your health care provider. Make sure you discuss any questions you have with your health care provider. Document Released: 09/20/2007 Document Revised: 09/09/2015 Document Reviewed: 09/10/2013  2017 [employer]  

## 2016-06-06 NOTE — Progress Notes (Signed)
   PRENATAL VISIT NOTE  Subjective:  Miranda DumasKasaundra Merlos is a 26 y.o. G3P1011 at 6895w4d being seen today for ongoing prenatal care.  She is currently monitored for the following issues for this low-risk pregnancy and has Supervision of normal pregnancy; Rh negative state in antepartum period; Thyroid enlarged; and Marijuana abuse on her problem list.  Patient reports no complaints.  Contractions: Not present. Vag. Bleeding: None.  Movement: Present. Denies leaking of fluid.   The following portions of the patient's history were reviewed and updated as appropriate: allergies, current medications, past family history, past medical history, past social history, past surgical history and problem list. Problem list updated.  Objective:   Vitals:   06/06/16 1441  BP: (!) 114/59  Pulse: 83  Weight: 146 lb 4.8 oz (66.4 kg)    Fetal Status: Fetal Heart Rate (bpm): 122   Movement: Present     General:  Alert, oriented and cooperative. Patient is in no acute distress.  Skin: Skin is warm and dry. No rash noted.   Cardiovascular: Normal heart rate noted  Respiratory: Normal respiratory effort, no problems with respiration noted  Abdomen: Soft, gravid, appropriate for gestational age. Pain/Pressure: Absent     Pelvic:  Cervical exam deferred        Extremities: Normal range of motion.  Edema: None  Mental Status: Normal mood and affect. Normal behavior. Normal judgment and thought content.   Assessment and Plan:  Pregnancy: G3P1011 at 7195w4d  1. Encounter for supervision of normal pregnancy in multigravida in second trimester - US MFM OB FOLLOW UP; Future -Patient scheduled for follow up US for growth on [id number] -Rx for prenatal vitamins sent  2. Encounter for supervision of normal first pregnancy in second trimeste  Preterm labor symptoms and general obstetric precautions including but not limited to vaginal bleeding, contractions, leaking of fluid and fetal movement were reviewed in detail  with the patient. Please refer to After Visit Summary for other counseling recommendations.  Return in about 4 weeks (around 07/04/2016) for ROB.   Marylene LandKathryn Lorraine Maxden Naji, CNM

## 2016-06-15 ENCOUNTER — Encounter: Payer: Self-pay | Admitting: General Practice

## 2016-06-27 ENCOUNTER — Ambulatory Visit (HOSPITAL_COMMUNITY): Payer: Medicaid Other

## 2016-06-28 ENCOUNTER — Other Ambulatory Visit: Payer: Self-pay | Admitting: Student

## 2016-06-28 ENCOUNTER — Ambulatory Visit (HOSPITAL_COMMUNITY)
Admission: RE | Admit: 2016-06-28 | Discharge: 2016-06-28 | Disposition: A | Discharge status: Home / Self Care | Payer: Medicaid Other | Source: Ambulatory Visit | Attending: Student | Admitting: Student

## 2016-06-28 DIAGNOSIS — Z3687 Encounter for antenatal screening for uncertain dates: Secondary | ICD-10-CM | POA: Diagnosis not present

## 2016-06-28 DIAGNOSIS — Z3A22 22 weeks gestation of pregnancy: Secondary | ICD-10-CM

## 2016-06-28 DIAGNOSIS — O4440 Low lying placenta NOS or without hemorrhage, unspecified trimester: Secondary | ICD-10-CM

## 2016-06-28 DIAGNOSIS — O0932 Supervision of pregnancy with insufficient antenatal care, second trimester: Secondary | ICD-10-CM | POA: Insufficient documentation

## 2016-06-28 DIAGNOSIS — Z3A21 21 weeks gestation of pregnancy: Secondary | ICD-10-CM | POA: Insufficient documentation

## 2016-06-28 DIAGNOSIS — Z3482 Encounter for supervision of other normal pregnancy, second trimester: Secondary | ICD-10-CM

## 2016-07-04 ENCOUNTER — Ambulatory Visit (INDEPENDENT_AMBULATORY_CARE_PROVIDER_SITE_OTHER): Payer: Medicaid Other | Admitting: Advanced Practice Midwife

## 2016-07-04 DIAGNOSIS — Z3482 Encounter for supervision of other normal pregnancy, second trimester: Secondary | ICD-10-CM | POA: Diagnosis not present

## 2016-07-04 DIAGNOSIS — Z3402 Encounter for supervision of normal first pregnancy, second trimester: Secondary | ICD-10-CM

## 2016-07-04 DIAGNOSIS — O99282 Endocrine, nutritional and metabolic diseases complicating pregnancy, second trimester: Secondary | ICD-10-CM

## 2016-07-04 DIAGNOSIS — E049 Nontoxic goiter, unspecified: Secondary | ICD-10-CM

## 2016-07-04 NOTE — Patient Instructions (Signed)

## 2016-07-04 NOTE — Progress Notes (Signed)
   PRENATAL VISIT NOTE  Subjective:  Miranda Drake is a 26 y.o. G3P1011 at 770w4d being seen today for ongoing prenatal care.  She is currently monitored for the following issues for this low-risk pregnancy and has Supervision of normal pregnancy; Rh negative state in antepartum period; Thyroid enlarged; and Marijuana abuse on her problem list.  Patient reports no complaints and except some popping of joints.  Contractions: Irritability. Vag. Bleeding: None.  Movement: Present. Denies leaking of fluid.   The following portions of the patient's history were reviewed and updated as appropriate: allergies, current medications, past family history, past medical history, past social history, past surgical history and problem list. Problem list updated.  Objective:   Vitals:   07/04/16 1549  BP: 100/64  Pulse: 82  Weight: 154 lb 4.8 oz (70 kg)    Fetal Status: Fetal Heart Rate (bpm): 141   Movement: Present     General:  Alert, oriented and cooperative. Patient is in no acute distress.  Skin: Skin is warm and dry. No rash noted.   Cardiovascular: Normal heart rate noted  Respiratory: Normal respiratory effort, no problems with respiration noted  Abdomen: Soft, gravid, appropriate for gestational age. Pain/Pressure: Absent     Pelvic:  Cervical exam deferred        Extremities: Normal range of motion.  Edema: None  Mental Status: Normal mood and affect. Normal behavior. Normal judgment and thought content.   Assessment and Plan:  Pregnancy: G3P1011 at 8970w4d  1. Thyroid enlarged      TSH normal  2. Encounter for supervision of normal first pregnancy in second trimester      Discussed softening of cartilage with pregnancy      Discussed fetal growth      Plan glucola and Rhophylac at 28 weeks  Preterm labor symptoms and general obstetric precautions including but not limited to vaginal bleeding, contractions, leaking of fluid and fetal movement were reviewed in detail with the  patient. Please refer to After Visit Summary for other counseling recommendations.  Return in about 4 weeks (around 08/01/2016) for [facility].   Aviva SignsMarie L Sarajean Dessert, CNM

## 2016-08-01 ENCOUNTER — Encounter: Payer: Medicaid Other | Admitting: Obstetrics & Gynecology

## 2016-08-09 ENCOUNTER — Encounter: Payer: Medicaid Other | Admitting: Obstetrics and Gynecology

## 2016-08-10 ENCOUNTER — Encounter: Payer: Self-pay | Admitting: Obstetrics and Gynecology

## 2016-08-10 ENCOUNTER — Ambulatory Visit (INDEPENDENT_AMBULATORY_CARE_PROVIDER_SITE_OTHER): Payer: Medicaid Other | Admitting: Obstetrics and Gynecology

## 2016-08-10 VITALS — BP 133/66 | HR 78 | Wt 169.5 lb

## 2016-08-10 DIAGNOSIS — O99280 Endocrine, nutritional and metabolic diseases complicating pregnancy, unspecified trimester: Secondary | ICD-10-CM

## 2016-08-10 DIAGNOSIS — O26899 Other specified pregnancy related conditions, unspecified trimester: Secondary | ICD-10-CM

## 2016-08-10 DIAGNOSIS — Z6791 Unspecified blood type, Rh negative: Secondary | ICD-10-CM

## 2016-08-10 DIAGNOSIS — O360192 Maternal care for anti-D [Rh] antibodies, unspecified trimester, fetus 2: Secondary | ICD-10-CM | POA: Diagnosis not present

## 2016-08-10 DIAGNOSIS — O0930 Supervision of pregnancy with insufficient antenatal care, unspecified trimester: Secondary | ICD-10-CM | POA: Insufficient documentation

## 2016-08-10 DIAGNOSIS — O09899 Supervision of other high risk pregnancies, unspecified trimester: Secondary | ICD-10-CM

## 2016-08-10 DIAGNOSIS — E049 Nontoxic goiter, unspecified: Secondary | ICD-10-CM

## 2016-08-10 DIAGNOSIS — Z23 Encounter for immunization: Secondary | ICD-10-CM | POA: Diagnosis not present

## 2016-08-10 DIAGNOSIS — Z3402 Encounter for supervision of normal first pregnancy, second trimester: Secondary | ICD-10-CM

## 2016-08-10 DIAGNOSIS — O360121 Maternal care for anti-D [Rh] antibodies, second trimester, fetus 1: Secondary | ICD-10-CM

## 2016-08-10 MED ORDER — RHO D IMMUNE GLOBULIN 1500 UNIT/2ML IJ SOSY
300.0000 ug | PREFILLED_SYRINGE | Freq: Once | INTRAMUSCULAR | Status: AC
  Administered 2016-08-10: 300 ug via INTRAMUSCULAR

## 2016-08-10 MED ORDER — TETANUS-DIPHTH-ACELL PERTUSSIS 5-2.5-18.5 LF-MCG/0.5 IM SUSP: 0.5000 mL | Freq: Once | INTRAMUSCULAR | Status: DC

## 2016-08-10 NOTE — Progress Notes (Signed)
Rhogram given 1 Hour completed due at 12:00 Flu Shot needed  Tdap needed

## 2016-08-11 ENCOUNTER — Encounter: Payer: Medicaid Other | Admitting: Obstetrics & Gynecology

## 2016-08-11 LAB — HIV ANTIBODY (ROUTINE TESTING W REFLEX): HIV SCREEN 4TH GENERATION: NONREACTIVE

## 2016-08-11 LAB — CBC
HEMATOCRIT: 37.6 % (ref 34.0–46.6)
HEMOGLOBIN: 12.5 g/dL (ref 11.1–15.9)
MCH: 29.1 pg (ref 26.6–33.0)
MCHC: 33.2 g/dL (ref 31.5–35.7)
MCV: 87 fL (ref 79–97)
Platelets: 229 10*3/uL (ref 150–379)
RBC: 4.3 x10E6/uL (ref 3.77–5.28)
RDW: 14.4 % (ref 12.3–15.4)
WBC: 9.8 10*3/uL (ref 3.4–10.8)

## 2016-08-11 LAB — TSH: TSH: 1.54 u[IU]/mL (ref 0.450–4.500)

## 2016-08-11 LAB — ANTIBODY SCREEN: ANTIBODY SCREEN: NEGATIVE

## 2016-08-11 LAB — ABO AND RH: RH TYPE: NEGATIVE

## 2016-08-11 LAB — RPR: RPR Ser Ql: NONREACTIVE

## 2016-08-11 LAB — GLUCOSE TOLERANCE, 1 HOUR: Glucose, 1Hr PP: 94 mg/dL (ref 65–199)

## 2016-08-11 NOTE — Progress Notes (Signed)
Prenatal Visit Note Date: 08/11/2016 Clinic: [facility]  Subjective:  Miranda Drake is a 26 y.o. G3P1011 at [redacted]w[redacted]d being seen today for ongoing prenatal care.  She is currently monitored for the following issues for this high-risk pregnancy and has Supervision of normal pregnancy; Rh negative state in antepartum period; Thyroid enlarged; Marijuana abuse; and Late prenatal care on her problem list.  Patient reports no complaints.   Contractions: Not present. Vag. Bleeding: None.  Movement: Present. Denies leaking of fluid.   The following portions of the patient's history were reviewed and updated as appropriate: allergies, current medications, past family history, past medical history, past social history, past surgical history and problem list. Problem list updated.  Objective:   Vitals:   08/10/16 1058  BP: 133/66  Pulse: 78  Weight: 169 lb 8 oz (76.9 kg)    Fetal Status: Fetal Heart Rate (bpm): 150 Fundal Height: 27 cm Movement: Present     General:  Alert, oriented and cooperative. Patient is in no acute distress.  Skin: Skin is warm and dry. No rash noted.   Cardiovascular: Normal heart rate noted  Respiratory: Normal respiratory effort, no problems with respiration noted  Abdomen: Soft, gravid, appropriate for gestational age. Pain/Pressure: Absent     Pelvic:  Cervical exam deferred        Extremities: Normal range of motion.  Edema: None  Mental Status: Normal mood and affect. Normal behavior. Normal judgment and thought content.   Urinalysis:      Assessment and Plan:  Pregnancy: G3P1011 at [redacted]w[redacted]d  1. Encounter for supervision of normal first pregnancy in second trimester Routine care.  - CBC - RPR - HIV antibody (with reflex) - Glucose tolerance, 1 hour - rho (d) immune globulin (RHIG/RHOPHYLAC) injection 300 mcg; Inject 2 mLs (300 mcg total) into the muscle once. - Flu Vaccine QUAD 36+ mos IM  2. Rh negative state in antepartum period -  ABO AND RH  - Antibody screen - rho (d) immune globulin (RHIG/RHOPHYLAC) injection 300 mcg; Inject 2 mLs (300 mcg total) into the muscle once.  3. Thyroid enlarged Repeat surveillance lab. No s/s - TSH  Preterm labor symptoms and general obstetric precautions including but not limited to vaginal bleeding, contractions, leaking of fluid and fetal movement were reviewed in detail with the patient. Please refer to After Visit Summary for other counseling recommendations.  Return in about 2 weeks (around 08/24/2016) for 2-3wks rob.   [place] Bing, MD

## 2016-08-22 ENCOUNTER — Ambulatory Visit (INDEPENDENT_AMBULATORY_CARE_PROVIDER_SITE_OTHER): Payer: Medicaid Other | Admitting: Obstetrics and Gynecology

## 2016-08-22 ENCOUNTER — Encounter: Payer: Self-pay | Admitting: *Deleted

## 2016-08-22 VITALS — BP 119/60 | HR 84 | Wt 169.0 lb

## 2016-08-22 DIAGNOSIS — Z3400 Encounter for supervision of normal first pregnancy, unspecified trimester: Secondary | ICD-10-CM

## 2016-08-22 DIAGNOSIS — Z3492 Encounter for supervision of normal pregnancy, unspecified, second trimester: Secondary | ICD-10-CM

## 2016-08-22 NOTE — Progress Notes (Signed)
   PRENATAL VISIT NOTE  Subjective:  Reyne DumasKasaundra Bert is a 26 y.o. G3P1011 at 2279w4d being seen today for ongoing prenatal care.  She is currently monitored for the following issues for this low-risk pregnancy and has Supervision of normal pregnancy; Rh negative state in antepartum period; Thyroid enlarged; Marijuana abuse; and Late prenatal care on her problem list.  Patient reports no complaints.  Contractions: Not present. Vag. Bleeding: None.  Movement: Present. Denies leaking of fluid.   The following portions of the patient's history were reviewed and updated as appropriate: allergies, current medications, past family history, past medical history, past social history, past surgical history and problem list. Problem list updated.  Objective:   Vitals:   08/22/16 1111  BP: 119/60  Pulse: 84  Weight: 169 lb (76.7 kg)    Fetal Status: Fetal Heart Rate (bpm): 130   Movement: Present     General:  Alert, oriented and cooperative. Patient is in no acute distress.  Skin: Skin is warm and dry. No rash noted.   Cardiovascular: Normal heart rate noted  Respiratory: Normal respiratory effort, no problems with respiration noted  Abdomen: Soft, gravid, appropriate for gestational age. Pain/Pressure: Present     Pelvic:  Cervical exam deferred        Extremities: Normal range of motion.  Edema: None  Mental Status: Normal mood and affect. Normal behavior. Normal judgment and thought content.   Assessment and Plan:  Pregnancy: G3P1011 at 5579w4d  There are no diagnoses linked to this encounter. Preterm labor symptoms and general obstetric precautions including but not limited to vaginal bleeding, contractions, leaking of fluid and fetal movement were reviewed in detail with the patient. Please refer to After Visit Summary for other counseling recommendations.  Return in about 2 weeks (around 09/05/2016) for Return OB visit.   Raelyn Moraawson, Marriana Hibberd, CNM

## 2016-08-22 NOTE — Patient Instructions (Signed)
Fetal Movement Counts Patient Name: ________________________________________________ Patient Due Date: ____________________ What is a fetal movement count? A fetal movement count is the number of times that you feel your baby move during a certain amount of time. This may also be called a fetal kick count. A fetal movement count is recommended for every pregnant woman. You may be asked to start counting fetal movements as early as week 28 of your pregnancy. Pay attention to when your baby is most active. You may notice your baby's sleep and wake cycles. You may also notice things that make your baby move more. You should do a fetal movement count:  When your baby is normally most active.  At the same time each day. A good time to count movements is while you are resting, after having something to eat and drink. How do I count fetal movements? 1. Find a quiet, comfortable area. Sit, or lie down on your side. 2. Write down the date, the start time and stop time, and the number of movements that you felt between those two times. Take this information with you to your health care visits. 3. For 2 hours, count kicks, flutters, swishes, rolls, and jabs. You should feel at least 10 movements during 2 hours. 4. You may stop counting after you have felt 10 movements. 5. If you do not feel 10 movements in 2 hours, have something to eat and drink. Then, keep resting and counting for 1 hour. If you feel at least 4 movements during that hour, you may stop counting. Contact a health care provider if:  You feel fewer than 4 movements in 2 hours.  Your baby is not moving like he or she usually does. Date: ____________ Start time: ____________ Stop time: ____________ Movements: ____________ Date: ____________ Start time: ____________ Stop time: ____________ Movements: ____________ Date: ____________ Start time: ____________ Stop time: ____________ Movements: ____________ Date: ____________ Start time:  ____________ Stop time: ____________ Movements: ____________ Date: ____________ Start time: ____________ Stop time: ____________ Movements: ____________ Date: ____________ Start time: ____________ Stop time: ____________ Movements: ____________ Date: ____________ Start time: ____________ Stop time: ____________ Movements: ____________ Date: ____________ Start time: ____________ Stop time: ____________ Movements: ____________ Date: ____________ Start time: ____________ Stop time: ____________ Movements: ____________ This information is not intended to replace advice given to you by your health care provider. Make sure you discuss any questions you have with your health care provider. Document Released: 05/03/2006 Document Revised: 12/01/2015 Document Reviewed: 05/13/2015 Elsevier Interactive Patient Education  2017 [employer] Breastfeeding Deciding to breastfeed is one of the best choices you can make for you and your baby. A change in hormones during pregnancy causes your breast tissue to grow and increases the number and size of your milk ducts. These hormones also allow proteins, sugars, and fats from your blood supply to make breast milk in your milk-producing glands. Hormones prevent breast milk from being released before your baby is born as well as prompt milk flow after birth. Once breastfeeding has begun, thoughts of your baby, as well as his or her sucking or crying, can stimulate the release of milk from your milk-producing glands. Benefits of breastfeeding For Your Baby  Your first milk (colostrum) helps your baby's digestive system function better.  There are antibodies in your milk that help your baby fight off infections.  Your baby has a lower incidence of asthma, allergies, and sudden infant death syndrome.  The nutrients in breast milk are better for your baby than infant formulas and are  designed uniquely for your baby's needs.  Breast milk improves your baby's brain  development.  Your baby is less likely to develop other conditions, such as childhood obesity, asthma, or type 2 diabetes mellitus. For You  Breastfeeding helps to create a very special bond between you and your baby.  Breastfeeding is convenient. Breast milk is always available at the correct temperature and costs nothing.  Breastfeeding helps to burn calories and helps you lose the weight gained during pregnancy.  Breastfeeding makes your uterus contract to its prepregnancy size faster and slows bleeding (lochia) after you give birth.  Breastfeeding helps to lower your risk of developing type 2 diabetes mellitus, osteoporosis, and breast or ovarian cancer later in life. Signs that your baby is hungry Early Signs of Hunger  Increased alertness or activity.  Stretching.  Movement of the head from side to side.  Movement of the head and opening of the mouth when the corner of the mouth or cheek is stroked (rooting).  Increased sucking sounds, smacking lips, cooing, sighing, or squeaking.  Hand-to-mouth movements.  Increased sucking of fingers or hands. Late Signs of Hunger  Fussing.  Intermittent crying. Extreme Signs of Hunger  Signs of extreme hunger will require calming and consoling before your baby will be able to breastfeed successfully. Do not wait for the following signs of extreme hunger to occur before you initiate breastfeeding:  Restlessness.  A loud, strong cry.  Screaming. Breastfeeding basics  Breastfeeding Initiation  Find a comfortable place to sit or lie down, with your neck and back well supported.  Place a pillow or rolled up blanket under your baby to bring him or her to the level of your breast (if you are seated). Nursing pillows are specially designed to help support your arms and your baby while you breastfeed.  Make sure that your baby's abdomen is facing your abdomen.  Gently massage your breast. With your fingertips, massage from your  chest wall toward your nipple in a circular motion. This encourages milk flow. You may need to continue this action during the feeding if your milk flows slowly.  Support your breast with 4 fingers underneath and your thumb above your nipple. Make sure your fingers are well away from your nipple and your baby's mouth.  Stroke your baby's lips gently with your finger or nipple.  When your baby's mouth is open wide enough, quickly bring your baby to your breast, placing your entire nipple and as much of the colored area around your nipple (areola) as possible into your baby's mouth.  More areola should be visible above your baby's upper lip than below the lower lip.  Your baby's tongue should be between his or her lower gum and your breast.  Ensure that your baby's mouth is correctly positioned around your nipple (latched). Your baby's lips should create a seal on your breast and be turned out (everted).  It is common for your baby to suck about 2-3 minutes in order to start the flow of breast milk. Latching  Teaching your baby how to latch on to your breast properly is very important. An improper latch can cause nipple pain and decreased milk supply for you and poor weight gain in your baby. Also, if your baby is not latched onto your nipple properly, he or she may swallow some air during feeding. This can make your baby fussy. Burping your baby when you switch breasts during the feeding can help to get rid of the air. However, teaching  your baby to latch on properly is still the best way to prevent fussiness from swallowing air while breastfeeding. Signs that your baby has successfully latched on to your nipple:  Silent tugging or silent sucking, without causing you pain.  Swallowing heard between every 3-4 sucks.  Muscle movement above and in front of his or her ears while sucking. Signs that your baby has not successfully latched on to nipple:  Sucking sounds or smacking sounds from your  baby while breastfeeding.  Nipple pain. If you think your baby has not latched on correctly, slip your finger into the corner of your baby's mouth to break the suction and place it between your baby's gums. Attempt breastfeeding initiation again. Signs of Successful Breastfeeding  Signs from your baby:  A gradual decrease in the number of sucks or complete cessation of sucking.  Falling asleep.  Relaxation of his or her body.  Retention of a small amount of milk in his or her mouth.  Letting go of your breast by himself or herself. Signs from you:  Breasts that have increased in firmness, weight, and size 1-3 hours after feeding.  Breasts that are softer immediately after breastfeeding.  Increased milk volume, as well as a change in milk consistency and color by the fifth day of breastfeeding.  Nipples that are not sore, cracked, or bleeding. Signs That Your Pecola LeisureBaby is Getting Enough Milk  Wetting at least 1-2 diapers during the first 24 hours after birth.  Wetting at least 5-6 diapers every 24 hours for the first week after birth. The urine should be clear or pale yellow by 5 days after birth.  Wetting 6-8 diapers every 24 hours as your baby continues to grow and develop.  At least 3 stools in a 24-hour period by age 36 days. The stool should be soft and yellow.  At least 3 stools in a 24-hour period by age 3 days. The stool should be seedy and yellow.  No loss of weight greater than 10% of birth weight during the first 13 days of age.  Average weight gain of 4-7 ounces (113-198 g) per week after age 26 days.  Consistent daily weight gain by age 36 days, without weight loss after the age of 2 weeks. After a feeding, your baby may spit up a small amount. This is common. Breastfeeding frequency and duration Frequent feeding will help you make more milk and can prevent sore nipples and breast engorgement. Breastfeed when you feel the need to reduce the fullness of your breasts or  when your baby shows signs of hunger. This is called "breastfeeding on demand." Avoid introducing a pacifier to your baby while you are working to establish breastfeeding (the first 4-6 weeks after your baby is born). After this time you may choose to use a pacifier. Research has shown that pacifier use during the first year of a baby's life decreases the risk of sudden infant death syndrome (SIDS). Allow your baby to feed on each breast as long as he or she wants. Breastfeed until your baby is finished feeding. When your baby unlatches or [place] asleep while feeding from the first breast, offer the second breast. Because newborns are often sleepy in the first few weeks of life, you may need to awaken your baby to get him or her to feed. Breastfeeding times will vary from baby to baby. However, the following rules can serve as a guide to help you ensure that your baby is properly fed:  Newborns (babies  13 weeks of age or younger) may breastfeed every 1-3 hours.  Newborns should not go longer than 3 hours during the day or 5 hours during the night without breastfeeding.  You should breastfeed your baby a minimum of 8 times in a 24-hour period until you begin to introduce solid foods to your baby at around 42 months of age. Breast milk pumping Pumping and storing breast milk allows you to ensure that your baby is exclusively fed your breast milk, even at times when you are unable to breastfeed. This is especially important if you are going back to work while you are still breastfeeding or when you are not able to be present during feedings. Your lactation consultant can give you guidelines on how long it is safe to store breast milk. A breast pump is a machine that allows you to pump milk from your breast into a sterile bottle. The pumped breast milk can then be stored in a refrigerator or freezer. Some breast pumps are operated by hand, while others use electricity. Ask your lactation consultant which type  will work best for you. Breast pumps can be purchased, but some hospitals and breastfeeding support groups lease breast pumps on a monthly basis. A lactation consultant can teach you how to hand express breast milk, if you prefer not to use a pump. Caring for your breasts while you breastfeed Nipples can become dry, cracked, and sore while breastfeeding. The following recommendations can help keep your breasts moisturized and healthy:  Avoid using soap on your nipples.  Wear a supportive bra. Although not required, special nursing bras and tank tops are designed to allow access to your breasts for breastfeeding without taking off your entire bra or top. Avoid wearing underwire-style bras or extremely tight bras.  Air dry your nipples for 3-37minutes after each feeding.  Use only cotton bra pads to absorb leaked breast milk. Leaking of breast milk between feedings is normal.  Use lanolin on your nipples after breastfeeding. Lanolin helps to maintain your skin's normal moisture barrier. If you use pure lanolin, you do not need to wash it off before feeding your baby again. Pure lanolin is not toxic to your baby. You may also hand express a few drops of breast milk and gently massage that milk into your nipples and allow the milk to air dry. In the first few weeks after giving birth, some women experience extremely full breasts (engorgement). Engorgement can make your breasts feel heavy, warm, and tender to the touch. Engorgement peaks within 3-5 days after you give birth. The following recommendations can help ease engorgement:  Completely empty your breasts while breastfeeding or pumping. You may want to start by applying warm, moist heat (in the shower or with warm water-soaked hand towels) just before feeding or pumping. This increases circulation and helps the milk flow. If your baby does not completely empty your breasts while breastfeeding, pump any extra milk after he or she is finished.  Wear  a snug bra (nursing or regular) or tank top for 1-2 days to signal your body to slightly decrease milk production.  Apply ice packs to your breasts, unless this is too uncomfortable for you.  Make sure that your baby is latched on and positioned properly while breastfeeding. If engorgement persists after 48 hours of following these recommendations, contact your health care provider or a Advertising copywriter. Overall health care recommendations while breastfeeding  Eat healthy foods. Alternate between meals and snacks, eating 3 of each per day.  Because what you eat affects your breast milk, some of the foods may make your baby more irritable than usual. Avoid eating these foods if you are sure that they are negatively affecting your baby.  Drink milk, fruit juice, and water to satisfy your thirst (about 10 glasses a day).  Rest often, relax, and continue to take your prenatal vitamins to prevent fatigue, stress, and anemia.  Continue breast self-awareness checks.  Avoid chewing and smoking tobacco. Chemicals from cigarettes that pass into breast milk and exposure to secondhand smoke may harm your baby.  Avoid alcohol and drug use, including marijuana. Some medicines that may be harmful to your baby can pass through breast milk. It is important to ask your health care provider before taking any medicine, including all over-the-counter and prescription medicine as well as vitamin and herbal supplements. It is possible to become pregnant while breastfeeding. If birth control is desired, ask your health care provider about options that will be safe for your baby. Contact a health care provider if:  You feel like you want to stop breastfeeding or have become frustrated with breastfeeding.  You have painful breasts or nipples.  Your nipples are cracked or bleeding.  Your breasts are red, tender, or warm.  You have a swollen area on either breast.  You have a fever or chills.  You have  nausea or vomiting.  You have drainage other than breast milk from your nipples.  Your breasts do not become full before feedings by the fifth day after you give birth.  You feel sad and depressed.  Your baby is too sleepy to eat well.  Your baby is having trouble sleeping.  Your baby is wetting less than 3 diapers in a 24-hour period.  Your baby has less than 3 stools in a 24-hour period.  Your baby's skin or the white part of his or her eyes becomes yellow.  Your baby is not gaining weight by 72 days of age. Get help right away if:  Your baby is overly tired (lethargic) and does not want to wake up and feed.  Your baby develops an unexplained fever. This information is not intended to replace advice given to you by your health care provider. Make sure you discuss any questions you have with your health care provider. Document Released: 04/03/2005 Document Revised: 09/15/2015 Document Reviewed: 09/25/2012 Elsevier Interactive Patient Education  2017 [employer]. Third Trimester of Pregnancy The third trimester is from week 28 through week 40 (months 7 through 9). The third trimester is a time when the unborn baby (fetus) is growing rapidly. At the end of the ninth month, the fetus is about 20 inches in length and weighs 6-10 pounds. Body changes during your third trimester Your body will continue to go through many changes during pregnancy. The changes vary from woman to woman. During the third trimester:  Your weight will continue to increase. You can expect to gain 25-35 pounds (11-16 kg) by the end of the pregnancy.  You may begin to get stretch marks on your hips, abdomen, and breasts.  You may urinate more often because the fetus is moving lower into your pelvis and pressing on your bladder.  You may develop or continue to have heartburn. This is caused by increased hormones that slow down muscles in the digestive tract.  You may develop or continue to have constipation  because increased hormones slow digestion and cause the muscles that push waste through your intestines to relax.  You  may develop hemorrhoids. These are swollen veins (varicose veins) in the rectum that can itch or be painful.  You may develop swollen, bulging veins (varicose veins) in your legs.  You may have increased body aches in the pelvis, back, or thighs. This is due to weight gain and increased hormones that are relaxing your joints.  You may have changes in your hair. These can include thickening of your hair, rapid growth, and changes in texture. Some women also have hair loss during or after pregnancy, or hair that feels dry or thin. Your hair will most likely return to normal after your baby is born.  Your breasts will continue to grow and they will continue to become tender. A yellow fluid (colostrum) may leak from your breasts. This is the first milk you are producing for your baby.  Your belly button may stick out.  You may notice more swelling in your hands, face, or ankles.  You may have increased tingling or numbness in your hands, arms, and legs. The skin on your belly may also feel numb.  You may feel short of breath because of your expanding uterus.  You may have more problems sleeping. This can be caused by the size of your belly, increased need to urinate, and an increase in your body's metabolism.  You may notice the fetus "dropping," or moving lower in your abdomen (lightening).  You may have increased vaginal discharge.  You may notice your joints feel loose and you may have pain around your pelvic bone. What to expect at prenatal visits You will have prenatal exams every 2 weeks until week 36. Then you will have weekly prenatal exams. During a routine prenatal visit:  You will be weighed to make sure you and the baby are growing normally.  Your blood pressure will be taken.  Your abdomen will be measured to track your baby's growth.  The fetal heartbeat  will be listened to.  Any test results from the previous visit will be discussed.  You may have a cervical check near your due date to see if your cervix has softened or thinned (effaced).  You will be tested for Group B streptococcus. This happens between 35 and 37 weeks. Your health care provider may ask you:  What your birth plan is.  How you are feeling.  If you are feeling the baby move.  If you have had any abnormal symptoms, such as leaking fluid, bleeding, severe headaches, or abdominal cramping.  If you are using any tobacco products, including cigarettes, chewing tobacco, and electronic cigarettes.  If you have any questions. Other tests or screenings that may be performed during your third trimester include:  Blood tests that check for low iron levels (anemia).  Fetal testing to check the health, activity level, and growth of the fetus. Testing is done if you have certain medical conditions or if there are problems during the pregnancy.  Nonstress test (NST). This test checks the health of your baby to make sure there are no signs of problems, such as the baby not getting enough oxygen. During this test, a belt is placed around your belly. The baby is made to move, and its heart rate is monitored during movement. What is false labor? False labor is a condition in which you feel small, irregular tightenings of the muscles in the womb (contractions) that usually go away with rest, changing position, or drinking water. These are called Braxton Hicks contractions. Contractions may last for hours, days, or  even weeks before true labor sets in. If contractions come at regular intervals, become more frequent, increase in intensity, or become painful, you should see your health care provider. What are the signs of labor?  Abdominal cramps.  Regular contractions that start at 10 minutes apart and become stronger and more frequent with time.  Contractions that start on the top of  the uterus and spread down to the lower abdomen and back.  Increased pelvic pressure and dull back pain.  A watery or bloody mucus discharge that comes from the vagina.  Leaking of amniotic fluid. This is also known as your "water breaking." It could be a slow trickle or a gush. Let your health care provider know if it has a color or strange odor. If you have any of these signs, call your health care provider right away, even if it is before your due date. Follow these instructions at home: Medicines   Follow your health care provider's instructions regarding medicine use. Specific medicines may be either safe or unsafe to take during pregnancy.  Take a prenatal vitamin that contains at least 600 micrograms (mcg) of folic acid.  If you develop constipation, try taking a stool softener if your health care provider approves. Eating and drinking   Eat a balanced diet that includes fresh fruits and vegetables, whole grains, good sources of protein such as meat, eggs, or tofu, and low-fat dairy. Your health care provider will help you determine the amount of weight gain that is right for you.  Avoid raw meat and uncooked cheese. These carry germs that can cause birth defects in the baby.  If you have low calcium intake from food, talk to your health care provider about whether you should take a daily calcium supplement.  Eat four or five small meals rather than three large meals a day.  Limit foods that are high in fat and processed sugars, such as fried and sweet foods.  To prevent constipation:  Drink enough fluid to keep your urine clear or pale yellow.  Eat foods that are high in fiber, such as fresh fruits and vegetables, whole grains, and beans. Activity   Exercise only as directed by your health care provider. Most women can continue their usual exercise routine during pregnancy. Try to exercise for 30 minutes at least 5 days a week. Stop exercising if you experience uterine  contractions.  Avoid heavy lifting.  Do not exercise in extreme heat or humidity, or at high altitudes.  Wear low-heel, comfortable shoes.  Practice good posture.  You may continue to have sex unless your health care provider tells you otherwise. Relieving pain and discomfort   Take frequent breaks and rest with your legs elevated if you have leg cramps or low back pain.  Take warm sitz baths to soothe any pain or discomfort caused by hemorrhoids. Use hemorrhoid cream if your health care provider approves.  Wear a good support bra to prevent discomfort from breast tenderness.  If you develop varicose veins:  Wear support pantyhose or compression stockings as told by your healthcare provider.  Elevate your feet for 15 minutes, 3-4 times a day. Prenatal care   Write down your questions. Take them to your prenatal visits.  Keep all your prenatal visits as told by your health care provider. This is important. Safety   Wear your seat belt at all times when driving.  Make a list of emergency phone numbers, including numbers for family, friends, the hospital, and police and  Garment/textile technologist. General instructions   Avoid cat litter boxes and soil used by cats. These carry germs that can cause birth defects in the baby. If you have a cat, ask someone to clean the litter box for you.  Do not travel far distances unless it is absolutely necessary and only with the approval of your health care provider.  Do not use hot tubs, steam rooms, or saunas.  Do not drink alcohol.  Do not use any products that contain nicotine or tobacco, such as cigarettes and e-cigarettes. If you need help quitting, ask your health care provider.  Do not use any medicinal herbs or unprescribed drugs. These chemicals affect the formation and growth of the baby.  Do not douche or use tampons or scented sanitary pads.  Do not cross your legs for long periods of time.  To prepare for the arrival of your  baby:  Take prenatal classes to understand, practice, and ask questions about labor and delivery.  Make a trial run to the hospital.  Visit the hospital and tour the maternity area.  Arrange for maternity or paternity leave through employers.  Arrange for family and friends to take care of pets while you are in the hospital.  Purchase a rear-facing car seat and make sure you know how to install it in your car.  Pack your hospital bag.  Prepare the baby's nursery. Make sure to remove all pillows and stuffed animals from the baby's crib to prevent suffocation.  Visit your dentist if you have not gone during your pregnancy. Use a soft toothbrush to brush your teeth and be gentle when you floss. Contact a health care provider if:  You are unsure if you are in labor or if your water has broken.  You become dizzy.  You have mild pelvic cramps, pelvic pressure, or nagging pain in your abdominal area.  You have lower back pain.  You have persistent nausea, vomiting, or diarrhea.  You have an unusual or bad smelling vaginal discharge.  You have pain when you urinate. Get help right away if:  Your water breaks before 37 weeks.  You have regular contractions less than 5 minutes apart before 37 weeks.  You have a fever.  You are leaking fluid from your vagina.  You have spotting or bleeding from your vagina.  You have severe abdominal pain or cramping.  You have rapid weight loss or weight gain.  You have shortness of breath with chest pain.  You notice sudden or extreme swelling of your face, hands, ankles, feet, or legs.  Your baby makes fewer than 10 movements in 2 hours.  You have severe headaches that do not go away when you take medicine.  You have vision changes. Summary  The third trimester is from week 28 through week 40, months 7 through 9. The third trimester is a time when the unborn baby (fetus) is growing rapidly.  During the third trimester, your  discomfort may increase as you and your baby continue to gain weight. You may have abdominal, leg, and back pain, sleeping problems, and an increased need to urinate.  During the third trimester your breasts will keep growing and they will continue to become tender. A yellow fluid (colostrum) may leak from your breasts. This is the first milk you are producing for your baby.  False labor is a condition in which you feel small, irregular tightenings of the muscles in the womb (contractions) that eventually go away. These are called [facility]  Hicks contractions. Contractions may last for hours, days, or even weeks before true labor sets in.  Signs of labor can include: abdominal cramps; regular contractions that start at 10 minutes apart and become stronger and more frequent with time; watery or bloody mucus discharge that comes from the vagina; increased pelvic pressure and dull back pain; and leaking of amniotic fluid. This information is not intended to replace advice given to you by your health care provider. Make sure you discuss any questions you have with your health care provider. Document Released: 03/28/2001 Document Revised: 09/09/2015 Document Reviewed: 06/04/2012 Elsevier Interactive Patient Education  2017 [employer] Sciatica Sciatica is pain, numbness, weakness, or tingling along the path of the sciatic nerve. The sciatic nerve starts in the lower back and runs down the back of each leg. The nerve controls the muscles in the lower leg and in the back of the knee. It also provides feeling (sensation) to the back of the thigh, the lower leg, and the sole of the foot. Sciatica is a symptom of another medical condition that pinches or puts pressure on the sciatic nerve. Generally, sciatica only affects one side of the body. Sciatica usually goes away on its own or with treatment. In some cases, sciatica may keep coming back (recur). What are the causes? This condition is caused by pressure  on the sciatic nerve, or pinching of the sciatic nerve. This may be the result of:  A disk in between the bones of the spine (vertebrae) bulging out too far (herniated disk).  Age-related changes in the spinal disks (degenerative disk disease).  A pain disorder that affects a muscle in the buttock (piriformis syndrome).  Extra bone growth (bone spur) near the sciatic nerve.  An injury or break (fracture) of the pelvis.  Pregnancy.  Tumor (rare). What increases the risk? The following factors may make you more likely to develop this condition:  Playing sports that place pressure or stress on the spine, such as football or weight lifting.  Having poor strength and flexibility.  A history of back injury.  A history of back surgery.  Sitting for long periods of time.  Doing activities that involve repetitive bending or lifting.  Obesity. What are the signs or symptoms? Symptoms can vary from mild to very severe, and they may include:  Any of these problems in the lower back, leg, hip, or buttock:  Mild tingling or dull aches.  Burning sensations.  Sharp pains.  Numbness in the back of the calf or the sole of the foot.  Leg weakness.  Severe back pain that makes movement difficult. These symptoms may get worse when you cough, sneeze, or laugh, or when you sit or stand for long periods of time. Being overweight may also make symptoms worse. In some cases, symptoms may recur over time. How is this diagnosed? This condition may be diagnosed based on:  Your symptoms.  A physical exam. Your health care provider may ask you to do certain movements to check whether those movements trigger your symptoms.  You may have tests, including:  Blood tests.  X-rays.  MRI.  CT scan. How is this treated? In many cases, this condition improves on its own, without any treatment. However, treatment may include:  Reducing or modifying physical activity during periods of  pain.  Exercising and stretching to strengthen your abdomen and improve the flexibility of your spine.  Icing and applying heat to the affected area.  Medicines that help:  To relieve  pain and swelling.  To relax your muscles.  Injections of medicines that help to relieve pain, irritation, and inflammation around the sciatic nerve (steroids).  Surgery. Follow these instructions at home: Medicines   Take over-the-counter and prescription medicines only as told by your health care provider.  Do not drive or operate heavy machinery while taking prescription pain medicine. Managing pain   If directed, apply ice to the affected area.  Put ice in a plastic bag.  Place a towel between your skin and the bag.  Leave the ice on for 20 minutes, 2-3 times a day.  After icing, apply heat to the affected area before you exercise or as often as told by your health care provider. Use the heat source that your health care provider recommends, such as a moist heat pack or a heating pad.  Place a towel between your skin and the heat source.  Leave the heat on for 20-30 minutes.  Remove the heat if your skin turns bright red. This is especially important if you are unable to feel pain, heat, or cold. You may have a greater risk of getting burned. Activity   Return to your normal activities as told by your health care provider. Ask your health care provider what activities are safe for you.  Avoid activities that make your symptoms worse.  Take brief periods of rest throughout the day. Resting in a lying or standing position is usually better than sitting to rest.  When you rest for longer periods, mix in some mild activity or stretching between periods of rest. This will help to prevent stiffness and pain.  Avoid sitting for long periods of time without moving. Get up and move around at least one time each hour.  Exercise and stretch regularly, as told by your health care  provider.  Do not lift anything that is heavier than 10 lb (4.5 kg) while you have symptoms of sciatica. When you do not have symptoms, you should still avoid heavy lifting, especially repetitive heavy lifting.  When you lift objects, always use proper lifting technique, which includes:  Bending your knees.  Keeping the load close to your body.  Avoiding twisting. General instructions   Use good posture.  Avoid leaning forward while sitting.  Avoid hunching over while standing.  Maintain a healthy weight. Excess weight puts extra stress on your back and makes it difficult to maintain good posture.  Wear supportive, comfortable shoes. Avoid wearing high heels.  Avoid sleeping on a mattress that is too soft or too hard. A mattress that is firm enough to support your back when you sleep may help to reduce your pain.  Keep all follow-up visits as told by your health care provider. This is important. Contact a health care provider if:  You have pain that wakes you up when you are sleeping.  You have pain that gets worse when you lie down.  Your pain is worse than you have experienced in the past.  Your pain lasts longer than 4 weeks.  You experience unexplained weight loss. Get help right away if:  You lose control of your bowel or bladder (incontinence).  You have:  Weakness in your lower back, pelvis, buttocks, or legs that gets worse.  Redness or swelling of your back.  A burning sensation when you urinate. This information is not intended to replace advice given to you by your health care provider. Make sure you discuss any questions you have with your health care provider.  Document Released: 03/28/2001 Document Revised: 09/07/2015 Document Reviewed: 12/11/2014 Elsevier Interactive Patient Education  2017 [employer].

## 2016-08-22 NOTE — Progress Notes (Signed)
Pt reports back and leg pain.

## 2016-09-06 ENCOUNTER — Ambulatory Visit (INDEPENDENT_AMBULATORY_CARE_PROVIDER_SITE_OTHER): Payer: Medicaid Other | Admitting: Advanced Practice Midwife

## 2016-09-06 ENCOUNTER — Encounter: Payer: Self-pay | Admitting: Advanced Practice Midwife

## 2016-09-06 VITALS — BP 111/65 | HR 92 | Wt 171.2 lb

## 2016-09-06 DIAGNOSIS — Z3483 Encounter for supervision of other normal pregnancy, third trimester: Secondary | ICD-10-CM

## 2016-09-06 NOTE — Patient Instructions (Signed)
Third Trimester of Pregnancy The third trimester is from week 28 through week 40 (months 7 through 9). The third trimester is a time when the unborn baby (fetus) is growing rapidly. At the end of the ninth month, the fetus is about 20 inches in length and weighs 6-10 pounds. Body changes during your third trimester Your body will continue to go through many changes during pregnancy. The changes vary from woman to woman. During the third trimester:  Your weight will continue to increase. You can expect to gain 25-35 pounds (11-16 kg) by the end of the pregnancy.  You may begin to get stretch marks on your hips, abdomen, and breasts.  You may urinate more often because the fetus is moving lower into your pelvis and pressing on your bladder.  You may develop or continue to have heartburn. This is caused by increased hormones that slow down muscles in the digestive tract.  You may develop or continue to have constipation because increased hormones slow digestion and cause the muscles that push waste through your intestines to relax.  You may develop hemorrhoids. These are swollen veins (varicose veins) in the rectum that can itch or be painful.  You may develop swollen, bulging veins (varicose veins) in your legs.  You may have increased body aches in the pelvis, back, or thighs. This is due to weight gain and increased hormones that are relaxing your joints.  You may have changes in your hair. These can include thickening of your hair, rapid growth, and changes in texture. Some women also have hair loss during or after pregnancy, or hair that feels dry or thin. Your hair will most likely return to normal after your baby is born.  Your breasts will continue to grow and they will continue to become tender. A yellow fluid (colostrum) may leak from your breasts. This is the first milk you are producing for your baby.  Your belly button may stick out.  You may notice more swelling in your hands,  face, or ankles.  You may have increased tingling or numbness in your hands, arms, and legs. The skin on your belly may also feel numb.  You may feel short of breath because of your expanding uterus.  You may have more problems sleeping. This can be caused by the size of your belly, increased need to urinate, and an increase in your body's metabolism.  You may notice the fetus "dropping," or moving lower in your abdomen (lightening).  You may have increased vaginal discharge.  You may notice your joints feel loose and you may have pain around your pelvic bone.  What to expect at prenatal visits You will have prenatal exams every 2 weeks until week 36. Then you will have weekly prenatal exams. During a routine prenatal visit:  You will be weighed to make sure you and the baby are growing normally.  Your blood pressure will be taken.  Your abdomen will be measured to track your baby's growth.  The fetal heartbeat will be listened to.  Any test results from the previous visit will be discussed.  You may have a cervical check near your due date to see if your cervix has softened or thinned (effaced).  You will be tested for Group B streptococcus. This happens between 35 and 37 weeks.  Your health care provider may ask you:  What your birth plan is.  How you are feeling.  If you are feeling the baby move.  If you have had   any abnormal symptoms, such as leaking fluid, bleeding, severe headaches, or abdominal cramping.  If you are using any tobacco products, including cigarettes, chewing tobacco, and electronic cigarettes.  If you have any questions.  Other tests or screenings that may be performed during your third trimester include:  Blood tests that check for low iron levels (anemia).  Fetal testing to check the health, activity level, and growth of the fetus. Testing is done if you have certain medical conditions or if there are problems during the  pregnancy.  Nonstress test (NST). This test checks the health of your baby to make sure there are no signs of problems, such as the baby not getting enough oxygen. During this test, a belt is placed around your belly. The baby is made to move, and its heart rate is monitored during movement.  What is false labor? False labor is a condition in which you feel small, irregular tightenings of the muscles in the womb (contractions) that usually go away with rest, changing position, or drinking water. These are called Braxton Hicks contractions. Contractions may last for hours, days, or even weeks before true labor sets in. If contractions come at regular intervals, become more frequent, increase in intensity, or become painful, you should see your health care provider. What are the signs of labor?  Abdominal cramps.  Regular contractions that start at 10 minutes apart and become stronger and more frequent with time.  Contractions that start on the top of the uterus and spread down to the lower abdomen and back.  Increased pelvic pressure and dull back pain.  A watery or bloody mucus discharge that comes from the vagina.  Leaking of amniotic fluid. This is also known as your "water breaking." It could be a slow trickle or a gush. Let your health care provider know if it has a color or strange odor. If you have any of these signs, call your health care provider right away, even if it is before your due date. Follow these instructions at home: Medicines  Follow your health care provider's instructions regarding medicine use. Specific medicines may be either safe or unsafe to take during pregnancy.  Take a prenatal vitamin that contains at least 600 micrograms (mcg) of folic acid.  If you develop constipation, try taking a stool softener if your health care provider approves. Eating and drinking  Eat a balanced diet that includes fresh fruits and vegetables, whole grains, good sources of protein  such as meat, eggs, or tofu, and low-fat dairy. Your health care provider will help you determine the amount of weight gain that is right for you.  Avoid raw meat and uncooked cheese. These carry germs that can cause birth defects in the baby.  If you have low calcium intake from food, talk to your health care provider about whether you should take a daily calcium supplement.  Eat four or five small meals rather than three large meals a day.  Limit foods that are high in fat and processed sugars, such as fried and sweet foods.  To prevent constipation: ? Drink enough fluid to keep your urine clear or pale yellow. ? Eat foods that are high in fiber, such as fresh fruits and vegetables, whole grains, and beans. Activity  Exercise only as directed by your health care provider. Most women can continue their usual exercise routine during pregnancy. Try to exercise for 30 minutes at least 5 days a week. Stop exercising if you experience uterine contractions.  Avoid heavy   lifting.  Do not exercise in extreme heat or humidity, or at high altitudes.  Wear low-heel, comfortable shoes.  Practice good posture.  You may continue to have sex unless your health care provider tells you otherwise. Relieving pain and discomfort  Take frequent breaks and rest with your legs elevated if you have leg cramps or low back pain.  Take warm sitz baths to soothe any pain or discomfort caused by hemorrhoids. Use hemorrhoid cream if your health care provider approves.  Wear a good support bra to prevent discomfort from breast tenderness.  If you develop varicose veins: ? Wear support pantyhose or compression stockings as told by your healthcare provider. ? Elevate your feet for 15 minutes, 3-4 times a day. Prenatal care  Write down your questions. Take them to your prenatal visits.  Keep all your prenatal visits as told by your health care provider. This is important. Safety  Wear your seat belt at  all times when driving.  Make a list of emergency phone numbers, including numbers for family, friends, the hospital, and police and fire departments. General instructions  Avoid cat litter boxes and soil used by cats. These carry germs that can cause birth defects in the baby. If you have a cat, ask someone to clean the litter box for you.  Do not travel far distances unless it is absolutely necessary and only with the approval of your health care provider.  Do not use hot tubs, steam rooms, or saunas.  Do not drink alcohol.  Do not use any products that contain nicotine or tobacco, such as cigarettes and e-cigarettes. If you need help quitting, ask your health care provider.  Do not use any medicinal herbs or unprescribed drugs. These chemicals affect the formation and growth of the baby.  Do not douche or use tampons or scented sanitary pads.  Do not cross your legs for long periods of time.  To prepare for the arrival of your baby: ? Take prenatal classes to understand, practice, and ask questions about labor and delivery. ? Make a trial run to the hospital. ? Visit the hospital and tour the maternity area. ? Arrange for maternity or paternity leave through employers. ? Arrange for family and friends to take care of pets while you are in the hospital. ? Purchase a rear-facing car seat and make sure you know how to install it in your car. ? Pack your hospital bag. ? Prepare the baby's nursery. Make sure to remove all pillows and stuffed animals from the baby's crib to prevent suffocation.  Visit your dentist if you have not gone during your pregnancy. Use a soft toothbrush to brush your teeth and be gentle when you floss. Contact a health care provider if:  You are unsure if you are in labor or if your water has broken.  You become dizzy.  You have mild pelvic cramps, pelvic pressure, or nagging pain in your abdominal area.  You have lower back pain.  You have persistent  nausea, vomiting, or diarrhea.  You have an unusual or bad smelling vaginal discharge.  You have pain when you urinate. Get help right away if:  Your water breaks before 37 weeks.  You have regular contractions less than 5 minutes apart before 37 weeks.  You have a fever.  You are leaking fluid from your vagina.  You have spotting or bleeding from your vagina.  You have severe abdominal pain or cramping.  You have rapid weight loss or weight gain.    You have shortness of breath with chest pain.  You notice sudden or extreme swelling of your face, hands, ankles, feet, or legs.  Your baby makes fewer than 10 movements in 2 hours.  You have severe headaches that do not go away when you take medicine.  You have vision changes. Summary  The third trimester is from week 28 through week 40, months 7 through 9. The third trimester is a time when the unborn baby (fetus) is growing rapidly.  During the third trimester, your discomfort may increase as you and your baby continue to gain weight. You may have abdominal, leg, and back pain, sleeping problems, and an increased need to urinate.  During the third trimester your breasts will keep growing and they will continue to become tender. A yellow fluid (colostrum) may leak from your breasts. This is the first milk you are producing for your baby.  False labor is a condition in which you feel small, irregular tightenings of the muscles in the womb (contractions) that eventually go away. These are called Braxton Hicks contractions. Contractions may last for hours, days, or even weeks before true labor sets in.  Signs of labor can include: abdominal cramps; regular contractions that start at 10 minutes apart and become stronger and more frequent with time; watery or bloody mucus discharge that comes from the vagina; increased pelvic pressure and dull back pain; and leaking of amniotic fluid. This information is not intended to replace advice  given to you by your health care provider. Make sure you discuss any questions you have with your health care provider. Document Released: 03/28/2001 Document Revised: 09/09/2015 Document Reviewed: 06/04/2012 Elsevier Interactive Patient Education  2017 [employer]  

## 2016-09-06 NOTE — Progress Notes (Signed)
Breastfeeding tip reviewed 

## 2016-09-06 NOTE — Progress Notes (Signed)
   PRENATAL VISIT NOTE  Subjective:  Miranda Drake is a 26 y.o. G3P1011 at 7173w5d being seen today for ongoing prenatal care.  She is currently monitored for the following issues for this low-risk pregnancy and has Supervision of normal pregnancy; Rh negative state in antepartum period; Thyroid enlarged; Marijuana abuse; and Late prenatal care on her problem list.  Patient reports no complaints.  Contractions: Not present. Vag. Bleeding: None.  Movement: Present. Denies leaking of fluid.   The following portions of the patient's history were reviewed and updated as appropriate: allergies, current medications, past family history, past medical history, past social history, past surgical history and problem list. Problem list updated.  Objective:   Vitals:   09/06/16 1014  BP: 111/65  Pulse: 92  Weight: 171 lb 3.2 oz (77.7 kg)    Fetal Status: Fetal Heart Rate (bpm): 130   Movement: Present     General:  Alert, oriented and cooperative. Patient is in no acute distress.  Skin: Skin is warm and dry. No rash noted.   Cardiovascular: Normal heart rate noted  Respiratory: Normal respiratory effort, no problems with respiration noted  Abdomen: Soft, gravid, appropriate for gestational age. Pain/Pressure: Present     Pelvic:  Cervical exam deferred        Extremities: Normal range of motion.  Edema: None  Mental Status: Normal mood and affect. Normal behavior. Normal judgment and thought content.   Assessment and Plan:  Pregnancy: G3P1011 at 9173w5d  There are no diagnoses linked to this encounter. Preterm labor symptoms and general obstetric precautions including but not limited to vaginal bleeding, contractions, leaking of fluid and fetal movement were reviewed in detail with the patient. Please refer to After Visit Summary for other counseling recommendations.  RTO 2 weeks  Wynelle BourgeoisMarie Olis Viverette, CNM

## 2016-09-26 ENCOUNTER — Encounter: Payer: Medicaid Other | Admitting: Advanced Practice Midwife

## 2016-10-04 ENCOUNTER — Encounter: Payer: Medicaid Other | Admitting: Advanced Practice Midwife

## 2016-10-11 ENCOUNTER — Ambulatory Visit (INDEPENDENT_AMBULATORY_CARE_PROVIDER_SITE_OTHER): Payer: Medicaid Other | Admitting: Student

## 2016-10-11 VITALS — BP 124/76 | HR 76 | Wt 177.8 lb

## 2016-10-11 DIAGNOSIS — Z3483 Encounter for supervision of other normal pregnancy, third trimester: Secondary | ICD-10-CM

## 2016-10-11 DIAGNOSIS — Z3480 Encounter for supervision of other normal pregnancy, unspecified trimester: Secondary | ICD-10-CM

## 2016-10-11 DIAGNOSIS — Z113 Encounter for screening for infections with a predominantly sexual mode of transmission: Secondary | ICD-10-CM

## 2016-10-11 LAB — OB RESULTS CONSOLE GBS: STREP GROUP B AG: NEGATIVE

## 2016-10-11 NOTE — Patient Instructions (Signed)

## 2016-10-11 NOTE — Progress Notes (Signed)
   PRENATAL VISIT NOTE  Subjective:  Miranda DumasKasaundra Drake is a [age] y.o. G3P1011 at 4914w5d being seen today for ongoing prenatal care.  She is currently monitored for the following issues for this low-risk pregnancy and has Supervision of normal pregnancy; Rh negative state in antepartum period; Thyroid enlarged; Marijuana abuse; and Late prenatal care on her problem list.  Patient reports no complaints.  Contractions: Not present.  .  Movement: Present. Denies leaking of fluid.   The following portions of the patient's history were reviewed and updated as appropriate: allergies, current medications, past family history, past medical history, past social history, past surgical history and problem list. Problem list updated.  Objective:   Vitals:   10/11/16 1103  BP: 124/76  Pulse: 76  Weight: 177 lb 12.8 oz (80.6 kg)    Fetal Status: Fetal Heart Rate (bpm): 132 Fundal Height: 36 cm Movement: Present  Presentation: Vertex  General:  Alert, oriented and cooperative. Patient is in no acute distress.  Skin: Skin is warm and dry. No rash noted.   Cardiovascular: Normal heart rate noted  Respiratory: Normal respiratory effort, no problems with respiration noted  Abdomen: Soft, gravid, appropriate for gestational age. Pain/Pressure: Present     Pelvic:  Cervical exam deferred        Extremities: Normal range of motion.     Mental Status: Normal mood and affect. Normal behavior. Normal judgment and thought content.   Assessment and Plan:  Pregnancy: G3P1011 at 2514w5d  1. Supervision of other normal pregnancy, antepartum Patient doing well, reviewed signs and labor.  - Culture, beta strep (group b only) - Cervicovaginal ancillary only  Term labor symptoms and general obstetric precautions including but not limited to vaginal bleeding, contractions, leaking of fluid and fetal movement were reviewed in detail with the patient. Please refer to After Visit Summary for other counseling  recommendations.  Return in about 1 week (around 10/18/2016) for ROB.   Marylene LandKathryn Lorraine Jaxtyn Linville, CNM

## 2016-10-12 LAB — CERVICOVAGINAL ANCILLARY ONLY
CHLAMYDIA, DNA PROBE: NEGATIVE
NEISSERIA GONORRHEA: NEGATIVE

## 2016-10-15 LAB — CULTURE, BETA STREP (GROUP B ONLY): STREP GP B CULTURE: NEGATIVE

## 2016-10-19 ENCOUNTER — Telehealth: Payer: Self-pay | Admitting: Family Medicine

## 2016-10-19 ENCOUNTER — Encounter: Payer: Self-pay | Admitting: Advanced Practice Midwife

## 2016-10-19 NOTE — Telephone Encounter (Signed)
Called talked to patient due to missing appointment today. Patient confirmed new appointment given for 10/26/2016 @10 :00am.

## 2016-10-23 ENCOUNTER — Encounter: Payer: Self-pay | Admitting: Obstetrics & Gynecology

## 2016-10-26 ENCOUNTER — Ambulatory Visit (INDEPENDENT_AMBULATORY_CARE_PROVIDER_SITE_OTHER): Payer: Medicaid Other | Admitting: Obstetrics and Gynecology

## 2016-10-26 VITALS — BP 117/72 | HR 92 | Wt 181.0 lb

## 2016-10-26 DIAGNOSIS — E049 Nontoxic goiter, unspecified: Secondary | ICD-10-CM

## 2016-10-26 DIAGNOSIS — O26899 Other specified pregnancy related conditions, unspecified trimester: Secondary | ICD-10-CM

## 2016-10-26 DIAGNOSIS — Z6791 Unspecified blood type, Rh negative: Secondary | ICD-10-CM

## 2016-10-26 DIAGNOSIS — O09893 Supervision of other high risk pregnancies, third trimester: Secondary | ICD-10-CM

## 2016-10-26 DIAGNOSIS — Z3483 Encounter for supervision of other normal pregnancy, third trimester: Secondary | ICD-10-CM

## 2016-10-26 NOTE — Progress Notes (Signed)
Subjective:  Miranda Drake is a 26 y.o. G3P1011 at 6962w6d being seen today for ongoing prenatal care.  She is currently monitored for the following issues for this low-risk pregnancy and has Supervision of normal pregnancy; Rh negative state in antepartum period; Thyroid enlarged; Marijuana abuse; and Late prenatal care on her problem list.  Patient reports general discomforts of pregnancy.  Contractions: Not present. Vag. Bleeding: None.  Movement: Present. Denies leaking of fluid.   The following portions of the patient's history were reviewed and updated as appropriate: allergies, current medications, past family history, past medical history, past social history, past surgical history and problem list. Problem list updated.  Objective:   Vitals:   10/26/16 1032  BP: 117/72  Pulse: 92  Weight: 181 lb (82.1 kg)    Fetal Status: Fetal Heart Rate (bpm): 128   Movement: Present     General:  Alert, oriented and cooperative. Patient is in no acute distress.  Skin: Skin is warm and dry. No rash noted.   Cardiovascular: Normal heart rate noted  Respiratory: Normal respiratory effort, no problems with respiration noted  Abdomen: Soft, gravid, appropriate for gestational age. Pain/Pressure: Present     Pelvic:  cervical performed        Extremities: Normal range of motion.  Edema: None  Mental Status: Normal mood and affect. Normal behavior. Normal judgment and thought content.   Urinalysis:      Assessment and Plan:  Pregnancy: G3P1011 at 4162w6d  1. Encounter for supervision of other normal pregnancy in third trimester Stable Labor precautions IOL scheduled 2. Rh negative state in antepartum period S/P rhogam  3. Thyroid enlarged Check TSh - TSH  Term labor symptoms and general obstetric precautions including but not limited to vaginal bleeding, contractions, leaking of fluid and fetal movement were reviewed in detail with the patient. Please refer to After Visit Summary for  other counseling recommendations.  Return in about 1 week (around 11/02/2016) for OB visit.   Hermina StaggersErvin, Michael L, MD

## 2016-10-26 NOTE — Patient Instructions (Signed)
Vaginal Delivery Vaginal delivery means that you will give birth by pushing your baby out of your birth canal (vagina). A team of health care providers will help you before, during, and after vaginal delivery. Birth experiences are unique for every woman and every pregnancy, and birth experiences vary depending on where you choose to give birth. What should I do to prepare for my baby's birth? Before your baby is born, it is important to talk with your health care provider about:  Your labor and delivery preferences. These may include: ? Medicines that you may be given. ? How you will manage your pain. This might include non-medical pain relief techniques or injectable pain relief such as epidural analgesia. ? How you and your baby will be monitored during labor and delivery. ? Who may be in the labor and delivery room with you. ? Your feelings about surgical delivery of your baby (cesarean delivery, or C-section) if this becomes necessary. ? Your feelings about receiving donated blood through an IV tube (blood transfusion) if this becomes necessary.  Whether you are able: ? To take pictures or videos of the birth. ? To eat during labor and delivery. ? To move around, walk, or change positions during labor and delivery.  What to expect after your baby is born, such as: ? Whether delayed umbilical cord clamping and cutting is offered. ? Who will care for your baby right after birth. ? Medicines or tests that may be recommended for your baby. ? Whether breastfeeding is supported in your hospital or birth center. ? How long you will be in the hospital or birth center.  How any medical conditions you have may affect your baby or your labor and delivery experience.  To prepare for your baby's birth, you should also:  Attend all of your health care visits before delivery (prenatal visits) as recommended by your health care provider. This is important.  Prepare your home for your baby's  arrival. Make sure that you have: ? Diapers. ? Baby clothing. ? Feeding equipment. ? Safe sleeping arrangements for you and your baby.  Install a car seat in your vehicle. Have your car seat checked by a certified car seat installer to make sure that it is installed safely.  Think about who will help you with your new baby at home for at least the first several weeks after delivery.  What can I expect when I arrive at the birth center or hospital? Once you are in labor and have been admitted into the hospital or birth center, your health care provider may:  Review your pregnancy history and any concerns you have.  Insert an IV tube into one of your veins. This is used to give you fluids and medicines.  Check your blood pressure, pulse, temperature, and heart rate (vital signs).  Check whether your bag of water (amniotic sac) has broken (ruptured).  Talk with you about your birth plan and discuss pain control options.  Monitoring Your health care provider may monitor your contractions (uterine monitoring) and your baby's heart rate (fetal monitoring). You may need to be monitored:  Often, but not continuously (intermittently).  All the time or for long periods at a time (continuously). Continuous monitoring may be needed if: ? You are taking certain medicines, such as medicine to relieve pain or make your contractions stronger. ? You have pregnancy or labor complications.  Monitoring may be done by:  Placing a special stethoscope or a handheld monitoring device on your abdomen to   check your baby's heartbeat, and feeling your abdomen for contractions. This method of monitoring does not continuously record your baby's heartbeat or your contractions.  Placing monitors on your abdomen (external monitors) to record your baby's heartbeat and the frequency and length of contractions. You may not have to wear external monitors all the time.  Placing monitors inside of your uterus  (internal monitors) to record your baby's heartbeat and the frequency, length, and strength of your contractions. ? Your health care provider may use internal monitors if he or she needs more information about the strength of your contractions or your baby's heart rate. ? Internal monitors are put in place by passing a thin, flexible wire through your vagina and into your uterus. Depending on the type of monitor, it may remain in your uterus or on your baby's head until birth. ? Your health care provider will discuss the benefits and risks of internal monitoring with you and will ask for your permission before inserting the monitors.  Telemetry. This is a type of continuous monitoring that can be done with external or internal monitors. Instead of having to stay in bed, you are able to move around during telemetry. Ask your health care provider if telemetry is an option for you.  Physical exam Your health care provider may perform a physical exam. This may include:  Checking whether your baby is positioned: ? With the head toward your vagina (head-down). This is most common. ? With the head toward the top of your uterus (head-up or breech). If your baby is in a breech position, your health care provider may try to turn your baby to a head-down position so you can deliver vaginally. If it does not seem that your baby can be born vaginally, your provider may recommend surgery to deliver your baby. In rare cases, you may be able to deliver vaginally if your baby is head-up (breech delivery). ? Lying sideways (transverse). Babies that are lying sideways cannot be delivered vaginally.  Checking your cervix to determine: ? Whether it is thinning out (effacing). ? Whether it is opening up (dilating). ? How low your baby has moved into your birth canal.  What are the three stages of labor and delivery?  Normal labor and delivery is divided into the following three stages: Stage 1  Stage 1 is the  longest stage of labor, and it can last for hours or days. Stage 1 includes: ? Early labor. This is when contractions may be irregular, or regular and mild. Generally, early labor contractions are more than 10 minutes apart. ? Active labor. This is when contractions get longer, more regular, more frequent, and more intense. ? The transition phase. This is when contractions happen very close together, are very intense, and may last longer than during any other part of labor.  Contractions generally feel mild, infrequent, and irregular at first. They get stronger, more frequent (about every 2-3 minutes), and more regular as you progress from early labor through active labor and transition.  Many women progress through stage 1 naturally, but you may need help to continue making progress. If this happens, your health care provider may talk with you about: ? Rupturing your amniotic sac if it has not ruptured yet. ? Giving you medicine to help make your contractions stronger and more frequent.  Stage 1 ends when your cervix is completely dilated to 4 inches (10 cm) and completely effaced. This happens at the end of the transition phase. Stage 2  Once   your cervix is completely effaced and dilated to 4 inches (10 cm), you may start to feel an urge to push. It is common for the body to naturally take a rest before feeling the urge to push, especially if you received an epidural or certain other pain medicines. This rest period may last for up to 1-2 hours, depending on your unique labor experience.  During stage 2, contractions are generally less painful, because pushing helps relieve contraction pain. Instead of contraction pain, you may feel stretching and burning pain, especially when the widest part of your baby's head passes through the vaginal opening (crowning).  Your health care provider will closely monitor your pushing progress and your baby's progress through the vagina during stage 2.  Your  health care provider may massage the area of skin between your vaginal opening and anus (perineum) or apply warm compresses to your perineum. This helps it stretch as the baby's head starts to crown, which can help prevent perineal tearing. ? In some cases, an incision may be made in your perineum (episiotomy) to allow the baby to pass through the vaginal opening. An episiotomy helps to make the opening of the vagina larger to allow more room for the baby to fit through.  It is very important to breathe and focus so your health care provider can control the delivery of your baby's head. Your health care provider may have you decrease the intensity of your pushing, to help prevent perineal tearing.  After delivery of your baby's head, the shoulders and the rest of the body generally deliver very quickly and without difficulty.  Once your baby is delivered, the umbilical cord may be cut right away, or this may be delayed for 1-2 minutes, depending on your baby's health. This may vary among health care providers, hospitals, and birth centers.  If you and your baby are healthy enough, your baby may be placed on your chest or abdomen to help maintain the baby's temperature and to help you bond with each other. Some mothers and babies start breastfeeding at this time. Your health care team will dry your baby and help keep your baby warm during this time.  Your baby may need immediate care if he or she: ? Showed signs of distress during labor. ? Has a medical condition. ? Was born too early (prematurely). ? Had a bowel movement before birth (meconium). ? Shows signs of difficulty transitioning from being inside the uterus to being outside of the uterus. If you are planning to breastfeed, your health care team will help you begin a feeding. Stage 3  The third stage of labor starts immediately after the birth of your baby and ends after you deliver the placenta. The placenta is an organ that develops  during pregnancy to provide oxygen and nutrients to your baby in the womb.  Delivering the placenta may require some pushing, and you may have mild contractions. Breastfeeding can stimulate contractions to help you deliver the placenta.  After the placenta is delivered, your uterus should tighten (contract) and become firm. This helps to stop bleeding in your uterus. To help your uterus contract and to control bleeding, your health care provider may: ? Give you medicine by injection, through an IV tube, by mouth, or through your rectum (rectally). ? Massage your abdomen or perform a vaginal exam to remove any blood clots that are left in your uterus. ? Empty your bladder by placing a thin, flexible tube (catheter) into your bladder. ? Encourage   you to breastfeed your baby. After labor is over, you and your baby will be monitored closely to ensure that you are both healthy until you are ready to go home. Your health care team will teach you how to care for yourself and your baby. This information is not intended to replace advice given to you by your health care provider. Make sure you discuss any questions you have with your health care provider. Document Released: 01/11/2008 Document Revised: 10/22/2015 Document Reviewed: 04/18/2015 Elsevier Interactive Patient Education  2018 [employer]  

## 2016-10-27 LAB — TSH: TSH: 3.42 u[IU]/mL (ref 0.450–4.500)

## 2016-10-31 ENCOUNTER — Encounter: Payer: Self-pay | Admitting: Medical

## 2016-11-01 ENCOUNTER — Encounter: Payer: Self-pay | Admitting: General Practice

## 2016-11-03 ENCOUNTER — Encounter (HOSPITAL_COMMUNITY): Payer: Self-pay | Admitting: *Deleted

## 2016-11-03 ENCOUNTER — Telehealth (HOSPITAL_COMMUNITY): Payer: Self-pay | Admitting: *Deleted

## 2016-11-03 NOTE — Telephone Encounter (Signed)
Preadmission screen  

## 2016-11-06 ENCOUNTER — Encounter: Payer: Self-pay | Admitting: Family Medicine

## 2016-11-06 ENCOUNTER — Telehealth: Payer: Self-pay | Admitting: Family Medicine

## 2016-11-06 ENCOUNTER — Encounter: Payer: Medicaid Other | Admitting: Certified Nurse Midwife

## 2016-11-06 NOTE — Telephone Encounter (Signed)
Left message for patient to return call, due to missed appt.

## 2016-11-07 ENCOUNTER — Ambulatory Visit: Payer: Self-pay

## 2016-11-07 ENCOUNTER — Ambulatory Visit (INDEPENDENT_AMBULATORY_CARE_PROVIDER_SITE_OTHER): Payer: Medicaid Other | Admitting: Advanced Practice Midwife

## 2016-11-07 ENCOUNTER — Ambulatory Visit (INDEPENDENT_AMBULATORY_CARE_PROVIDER_SITE_OTHER): Payer: Medicaid Other | Admitting: *Deleted

## 2016-11-07 VITALS — BP 116/69 | HR 81 | Wt 182.0 lb

## 2016-11-07 DIAGNOSIS — Z3483 Encounter for supervision of other normal pregnancy, third trimester: Secondary | ICD-10-CM

## 2016-11-07 DIAGNOSIS — O480 Post-term pregnancy: Secondary | ICD-10-CM | POA: Diagnosis present

## 2016-11-07 NOTE — Patient Instructions (Signed)
Labor Induction Labor induction is when steps are taken to cause a pregnant woman to begin the labor process. Most women go into labor on their own between 37 weeks and 42 weeks of the pregnancy. When this does not happen or when there is a medical need, methods may be used to induce labor. Labor induction causes a pregnant woman's uterus to contract. It also causes the cervix to soften (ripen), open (dilate), and thin out (efface). Usually, labor is not induced before 39 weeks of the pregnancy unless there is a problem with the baby or mother. Before inducing labor, your health care provider will consider a number of factors, including the following:  The medical condition of you and the baby.  How many weeks along you are.  The status of the baby's lung maturity.  The condition of the cervix.  The position of the baby. What are the reasons for labor induction? Labor may be induced for the following reasons:  The health of the baby or mother is at risk.  The pregnancy is overdue by 1 week or more.  The water breaks but labor does not start on its own.  The mother has a health condition or serious illness, such as high blood pressure, infection, placental abruption, or diabetes.  The amniotic fluid amounts are low around the baby.  The baby is distressed. Convenience or wanting the baby to be born on a certain date is not a reason for inducing labor. What methods are used for labor induction? Several methods of labor induction may be used, such as:  Prostaglandin medicine. This medicine causes the cervix to dilate and ripen. The medicine will also start contractions. It can be taken by mouth or by inserting a suppository into the vagina.  Inserting a thin tube (catheter) with a balloon on the end into the vagina to dilate the cervix. Once inserted, the balloon is expanded with water, which causes the cervix to open.  Stripping the membranes. Your health care provider separates  amniotic sac tissue from the cervix, causing the cervix to be stretched and causing the release of a hormone called progesterone. This may cause the uterus to contract. It is often done during an office visit. You will be sent home to wait for the contractions to begin. You will then come in for an induction.  Breaking the water. Your health care provider makes a hole in the amniotic sac using a small instrument. Once the amniotic sac breaks, contractions should begin. This may still take hours to see an effect.  Medicine to trigger or strengthen contractions. This medicine is given through an IV access tube inserted into a vein in your arm. All of the methods of induction, besides stripping the membranes, will be done in the hospital. Induction is done in the hospital so that you and the baby can be carefully monitored. How long does it take for labor to be induced? Some inductions can take up to 2-3 days. Depending on the cervix, it usually takes less time. It takes longer when you are induced early in the pregnancy or if this is your first pregnancy. If a mother is still pregnant and the induction has been going on for 2-3 days, either the mother will be sent home or a cesarean delivery will be needed. What are the risks associated with labor induction? Some of the risks of induction include:  Changes in fetal heart rate, such as too high, too low, or erratic.  Fetal distress.    Chance of infection for the mother and baby.  Increased chance of having a cesarean delivery.  Breaking off (abruption) of the placenta from the uterus (rare).  Uterine rupture (very rare). When induction is needed for medical reasons, the benefits of induction may outweigh the risks. What are some reasons for not inducing labor? Labor induction should not be done if:  It is shown that your baby does not tolerate labor.  You have had previous surgeries on your uterus, such as a myomectomy or the removal of  fibroids.  Your placenta lies very low in the uterus and blocks the opening of the cervix (placenta previa).  Your baby is not in a head-down position.  The umbilical cord drops down into the birth canal in front of the baby. This could cut off the baby's blood and oxygen supply.  You have had a previous cesarean delivery.  There are unusual circumstances, such as the baby being extremely premature. This information is not intended to replace advice given to you by your health care provider. Make sure you discuss any questions you have with your health care provider. Document Released: 08/23/2006 Document Revised: 09/09/2015 Document Reviewed: 10/31/2012 Elsevier Interactive Patient Education  2017 [employer]  

## 2016-11-07 NOTE — Progress Notes (Signed)
Pt informed that the ultrasound is considered a limited OB ultrasound and is not intended to be a complete ultrasound exam.  Patient also informed that the ultrasound is not being completed with the intent of assessing for fetal or placental anomalies or any pelvic abnormalities.  Explained that the purpose of today's ultrasound is to assess for presentation and amniotic fluid volume.  Patient acknowledges the purpose of the exam and the limitations of the study.    

## 2016-11-07 NOTE — Progress Notes (Signed)
   PRENATAL VISIT NOTE  Subjective:  Miranda Drake is a 26 y.o. G3P1011 at 8889w4d being seen today for ongoing prenatal care.  She is currently monitored for the following issues for this low-risk pregnancy and has Supervision of normal pregnancy; Rh negative state in antepartum period; Thyroid enlarged; Marijuana abuse; and Late prenatal care on her problem list.  Patient reports occasional contractions.  Contractions: Irregular. Vag. Bleeding: None.  Movement: Present. Denies leaking of fluid.   The following portions of the patient's history were reviewed and updated as appropriate: allergies, current medications, past family history, past medical history, past social history, past surgical history and problem list. Problem list updated.  Objective:   Vitals:   11/07/16 1327  BP: 116/69  Pulse: 81  Weight: 182 lb (82.6 kg)    Fetal Status: Fetal Heart Rate (bpm): NST   Movement: Present     General:  Alert, oriented and cooperative. Patient is in no acute distress.  Skin: Skin is warm and dry. No rash noted.   Cardiovascular: Normal heart rate noted  Respiratory: Normal respiratory effort, no problems with respiration noted  Abdomen: Soft, gravid, appropriate for gestational age.  Pain/Pressure: Present     Pelvic: Cervical exam declined        Extremities: Normal range of motion.  Edema: None  Mental Status:  Normal mood and affect. Normal behavior. Normal judgment and thought content.   Assessment and Plan:  Pregnancy: G3P1011 at 1989w4d  1. Post term pregnancy, antepartum condition or complication - IOL scheduled 41 weeks - NST reactive. Nml AFI  2. Encounter for supervision of other normal pregnancy in third trimester   Term labor symptoms and general obstetric precautions including but not limited to vaginal bleeding, contractions, leaking of fluid and fetal movement were reviewed in detail with the patient. Please refer to After Visit Summary for other counseling  recommendations.  Return in about 5 weeks (around 12/12/2016) for PP visit.  IOL on 7/28.   Dorathy KinsmanVirginia Trong Gosling, CNM

## 2016-11-11 ENCOUNTER — Encounter (HOSPITAL_COMMUNITY): Payer: Self-pay

## 2016-11-11 ENCOUNTER — Inpatient Hospital Stay (HOSPITAL_COMMUNITY): Payer: Medicaid Other | Admitting: Anesthesiology

## 2016-11-11 ENCOUNTER — Inpatient Hospital Stay (HOSPITAL_COMMUNITY)
Admission: RE | Admit: 2016-11-11 | Discharge: 2016-11-13 | DRG: 775 | Disposition: A | Discharge status: Home / Self Care | Payer: Medicaid Other | Source: Ambulatory Visit | Attending: Family Medicine | Admitting: Family Medicine

## 2016-11-11 DIAGNOSIS — Z3A41 41 weeks gestation of pregnancy: Secondary | ICD-10-CM | POA: Diagnosis not present

## 2016-11-11 DIAGNOSIS — Z6791 Unspecified blood type, Rh negative: Secondary | ICD-10-CM | POA: Diagnosis not present

## 2016-11-11 DIAGNOSIS — F1210 Cannabis abuse, uncomplicated: Secondary | ICD-10-CM | POA: Diagnosis present

## 2016-11-11 DIAGNOSIS — O480 Post-term pregnancy: Secondary | ICD-10-CM | POA: Diagnosis present

## 2016-11-11 DIAGNOSIS — O99324 Drug use complicating childbirth: Secondary | ICD-10-CM | POA: Diagnosis present

## 2016-11-11 DIAGNOSIS — Z87891 Personal history of nicotine dependence: Secondary | ICD-10-CM

## 2016-11-11 DIAGNOSIS — O26893 Other specified pregnancy related conditions, third trimester: Secondary | ICD-10-CM | POA: Diagnosis present

## 2016-11-11 DIAGNOSIS — Z880 Allergy status to penicillin: Secondary | ICD-10-CM

## 2016-11-11 LAB — CBC
HCT: 38.8 % (ref 36.0–46.0)
Hemoglobin: 13.6 g/dL (ref 12.0–15.0)
MCH: 29.6 pg (ref 26.0–34.0)
MCHC: 35.1 g/dL (ref 30.0–36.0)
MCV: 84.5 fL (ref 78.0–100.0)
Platelets: 202 10*3/uL (ref 150–400)
RBC: 4.59 MIL/uL (ref 3.87–5.11)
RDW: 14.5 % (ref 11.5–15.5)
WBC: 8.7 10*3/uL (ref 4.0–10.5)

## 2016-11-11 LAB — TYPE AND SCREEN
ABO/RH(D): O NEG
ANTIBODY SCREEN: NEGATIVE

## 2016-11-11 MED ORDER — MISOPROSTOL 200 MCG PO TABS: 50.0000 ug | ORAL_TABLET | ORAL | Status: DC | PRN

## 2016-11-11 MED ORDER — ACETAMINOPHEN 325 MG PO TABS: 650.0000 mg | ORAL_TABLET | ORAL | Status: DC | PRN

## 2016-11-11 MED ORDER — ONDANSETRON HCL 4 MG/2ML IJ SOLN: 4.0000 mg | Freq: Four times a day (QID) | INTRAMUSCULAR | Status: DC | PRN

## 2016-11-11 MED ORDER — EPHEDRINE 5 MG/ML INJ
10.0000 mg | INTRAVENOUS | Status: DC | PRN
  Filled 2016-11-11: qty 2

## 2016-11-11 MED ORDER — FENTANYL 2.5 MCG/ML BUPIVACAINE 1/10 % EPIDURAL INFUSION (WH - ANES)
INTRAMUSCULAR | Status: AC
  Filled 2016-11-11: qty 100

## 2016-11-11 MED ORDER — LACTATED RINGERS IV SOLN: 500.0000 mL | INTRAVENOUS | Status: DC | PRN

## 2016-11-11 MED ORDER — PHENYLEPHRINE 40 MCG/ML (10ML) SYRINGE FOR IV PUSH (FOR BLOOD PRESSURE SUPPORT)
PREFILLED_SYRINGE | INTRAVENOUS | Status: AC
  Filled 2016-11-11: qty 20

## 2016-11-11 MED ORDER — PHENYLEPHRINE 40 MCG/ML (10ML) SYRINGE FOR IV PUSH (FOR BLOOD PRESSURE SUPPORT)
80.0000 ug | PREFILLED_SYRINGE | INTRAVENOUS | Status: DC | PRN
  Filled 2016-11-11: qty 5

## 2016-11-11 MED ORDER — FENTANYL 2.5 MCG/ML BUPIVACAINE 1/10 % EPIDURAL INFUSION (WH - ANES)
14.0000 mL/h | INTRAMUSCULAR | Status: DC | PRN
Start: 2016-11-11 — End: 2016-11-12
  Administered 2016-11-11 – 2016-11-12 (×2): 14 mL/h via EPIDURAL
  Filled 2016-11-11: qty 100

## 2016-11-11 MED ORDER — DIPHENHYDRAMINE HCL 50 MG/ML IJ SOLN: 12.5000 mg | INTRAMUSCULAR | Status: DC | PRN

## 2016-11-11 MED ORDER — LACTATED RINGERS IV SOLN
INTRAVENOUS | Status: DC
  Administered 2016-11-11 (×2): via INTRAVENOUS

## 2016-11-11 MED ORDER — OXYCODONE-ACETAMINOPHEN 5-325 MG PO TABS: 2.0000 | ORAL_TABLET | ORAL | Status: DC | PRN

## 2016-11-11 MED ORDER — OXYTOCIN 40 UNITS IN LACTATED RINGERS INFUSION - SIMPLE MED
1.0000 m[IU]/min | INTRAVENOUS | Status: DC
  Filled 2016-11-11: qty 1000

## 2016-11-11 MED ORDER — SOD CITRATE-CITRIC ACID 500-334 MG/5ML PO SOLN: 30.0000 mL | ORAL | Status: DC | PRN

## 2016-11-11 MED ORDER — MISOPROSTOL 25 MCG QUARTER TABLET
25.0000 ug | ORAL_TABLET | ORAL | Status: DC | PRN
  Administered 2016-11-11: 25 ug via VAGINAL
  Filled 2016-11-11 (×2): qty 1

## 2016-11-11 MED ORDER — EPHEDRINE 5 MG/ML INJ
10.0000 mg | INTRAVENOUS | Status: DC | PRN
Start: 2016-11-11 — End: 2016-11-12
  Filled 2016-11-11: qty 2

## 2016-11-11 MED ORDER — LIDOCAINE HCL (PF) 1 % IJ SOLN
30.0000 mL | INTRAMUSCULAR | Status: DC | PRN
  Filled 2016-11-11: qty 30

## 2016-11-11 MED ORDER — PHENYLEPHRINE 40 MCG/ML (10ML) SYRINGE FOR IV PUSH (FOR BLOOD PRESSURE SUPPORT)
80.0000 ug | PREFILLED_SYRINGE | INTRAVENOUS | Status: DC | PRN
  Administered 2016-11-11: 80 ug via INTRAVENOUS
  Filled 2016-11-11: qty 5

## 2016-11-11 MED ORDER — FENTANYL CITRATE (PF) 100 MCG/2ML IJ SOLN
50.0000 ug | INTRAMUSCULAR | Status: DC | PRN
  Administered 2016-11-11 (×3): 100 ug via INTRAVENOUS
  Filled 2016-11-11 (×3): qty 2

## 2016-11-11 MED ORDER — FLEET ENEMA 7-19 GM/118ML RE ENEM: 1.0000 | ENEMA | RECTAL | Status: DC | PRN

## 2016-11-11 MED ORDER — LIDOCAINE HCL (PF) 1 % IJ SOLN
INTRAMUSCULAR | Status: DC | PRN
Start: 2016-11-11 — End: 2016-11-12
  Administered 2016-11-11: 6 mL via EPIDURAL
  Administered 2016-11-11: 4 mL

## 2016-11-11 MED ORDER — OXYTOCIN BOLUS FROM INFUSION
500.0000 mL | Freq: Once | INTRAVENOUS | Status: AC
  Administered 2016-11-12: 500 mL via INTRAVENOUS

## 2016-11-11 MED ORDER — LACTATED RINGERS IV SOLN
500.0000 mL | Freq: Once | INTRAVENOUS | Status: AC
  Administered 2016-11-11: 500 mL via INTRAVENOUS

## 2016-11-11 MED ORDER — TERBUTALINE SULFATE 1 MG/ML IJ SOLN
0.2500 mg | Freq: Once | INTRAMUSCULAR | Status: DC | PRN
  Filled 2016-11-11: qty 1

## 2016-11-11 MED ORDER — OXYCODONE-ACETAMINOPHEN 5-325 MG PO TABS: 1.0000 | ORAL_TABLET | ORAL | Status: DC | PRN

## 2016-11-11 MED ORDER — OXYTOCIN 40 UNITS IN LACTATED RINGERS INFUSION - SIMPLE MED: 2.5000 [IU]/h | INTRAVENOUS | Status: DC

## 2016-11-11 NOTE — Progress Notes (Signed)
LABOR PROGRESS NOTE  Reyne DumasKasaundra Nadel is a 26 y.o. G3P1011 at 1127w1d  admitted for IOL for postdates  Subjective: Patient doing well. Pain now controlled with epidural.  Objective: BP (!) 96/49   Pulse 67   Temp (!) 97.5 F (36.4 C) (Oral)   Resp 18   Ht 5\' 1"  (1.549 m)   Wt 181 lb (82.1 kg)   LMP 01/21/2016 (Approximate)   BMI 34.20 kg/m  or  Vitals:   11/11/16 2035 11/11/16 2040 11/11/16 2045 11/11/16 2100  BP: (!) 113/49 (!) 122/58 93/64 (!) 96/49  Pulse: 76 77 (!) 112 67  Resp:      Temp:      TempSrc:      Weight:      Height:        Dilation: 6 Effacement (%): 70 Cervical Position: Middle Station: -2 Presentation: Vertex Exam by:: amwalker,rn FHT: baseline rate 120, moderate varibility, +acel, early decel Toco: ctx q2 min  Labs: Lab Results  Component Value Date   WBC 8.7 11/11/2016   HGB 13.6 11/11/2016   HCT 38.8 11/11/2016   MCV 84.5 11/11/2016   PLT 202 11/11/2016    Patient Active Problem List   Diagnosis Date Noted  . Post-dates pregnancy 11/11/2016  . Late prenatal care 08/10/2016  . Marijuana abuse 05/11/2016  . Supervision of normal pregnancy 05/08/2016  . Rh negative state in antepartum period 05/08/2016  . Thyroid enlarged 05/08/2016    Assessment / Plan: 26 y.o. G3P1011 at 2927w1d here for IOL for postates  Labor: In early active labor. Expectant management. May start pitocin to augment if needed Fetal Wellbeing:  Cat I Pain Control:  Epidural Anticipated MOD:  SVD  Frederik PearJulie P Cylie Dor, MD 11/11/2016, 9:04 PM

## 2016-11-11 NOTE — H&P (Signed)
LABOR AND DELIVERY ADMISSION HISTORY AND PHYSICAL NOTE  Miranda Drake is a 26 y.o. female G3P1011 with IUP at 3729w1d by LMP presenting for IOL for posdates.   She reports positive fetal movement. She denies leakage of fluid or vaginal bleeding. Denies any concerns  Prenatal History/Complications: Clinic  [facility] P[facility]atal Labs  Dating  LMP Blood type: O/NEG/-- (01/22 0001)   Genetic Screen  Quad:  Order after US (dates unsure)  NIPS: Antibody:NEG (01/22 0001)  Anatomic US  Normal, 29th %ile Rubella: 3.78 (01/22 0001)  GTT Early:               Third trimester: neg RPR: NON REAC (01/22 0001)   Flu vaccine 08/10/2016 HBsAg: NEGATIVE (01/22 0001)   TDaP vaccine                                               Rhogam:08/10/2016 HIV: NONREACTIVE (01/22 0001)   Baby Food   Breast                                        GBS: (For PCN allergy, check sensitivities)  Contraception  Maybe IUD Pap: 04/2016  Circumcision  yes, outside   Pediatrician  undecided   Support Person  Robbie     Past Medical History: Past Medical History:  Diagnosis Date  . Medical history non-contributory     Past Surgical History: Past Surgical History:  Procedure Laterality Date  . NO PAST SURGERIES      Obstetrical History: OB History    Gravida Para Term Preterm AB Living   3 1 1   1 1    SAB TAB Ectopic Multiple Live Births   1       1      Social History: Social History   Social History  . Marital status: Single    Spouse name: N/A  . Number of children: N/A  . Years of education: N/A   Social History Main Topics  . Smoking status: Former Smoker    Packs/day: 0.30    Types: Cigarettes  . Smokeless tobacco: Never Used  . Alcohol use Yes     Comment: occ- pt states none since early pregnancy  . Drug use: Yes    Types: Marijuana     Comment: none since 6wks  . Sexual activity: Yes     Comment: pregnant   Other Topics Concern  . None   Social History Narrative  . None    Family  History: Family History  Problem Relation Age of Onset  . Hyperlipidemia Mother   . Hypertension Mother   . Hyperlipidemia Sister     Allergies: No Known Allergies  Prescriptions Prior to Admission  Medication Sig Dispense Refill Last Dose  . Prenatal Multivit-Min-Fe-FA (PRENATAL VITAMINS) 0.8 MG tablet Take 1 tablet by mouth daily. 30 tablet 12 Past Week at Unknown time     Review of Systems   All systems reviewed and negative except as stated in HPI  Blood pressure 125/70, pulse 87, temperature 98.1 F (36.7 C), temperature source Oral, resp. rate 18, height 5\' 1"  (1.549 m), weight 181 lb (82.1 kg), last menstrual period 01/21/2016, unknown if currently breastfeeding. General appearance: alert and cooperative, NAD Lungs: clear to auscultation bilaterally Heart:  regular rate and rhythm Abdomen: soft, non-tender; bowel sounds normal Extremities: No calf swelling or tenderness Presentation: cephalic Fetal monitoring: baseline rate 125, mod variability, +acel, no decel Uterine activity: q1-5 min (patient not feeling) Dilation: 1 Effacement (%): Thick Station: -3 Exam by:: Marcelino DusterMichelle, RN    Prenatal labs: ABO, Rh: --/--/O NEG (07/28 0858) Antibody: NEG (07/28 0858) Rubella: !Error! RPR: Non Reactive (04/26 1154)  HBsAg: NEGATIVE (01/22 0001)  HIV: NONREACTIVE (01/22 0001)  GBS: Negative (06/27 0000)  2 hr Glucola: negative Genetic screening:  n/a Anatomy US: normal  Prenatal Transfer Tool  Maternal Diabetes: No Genetic Screening: Declined Maternal Ultrasounds/Referrals: Normal Fetal Ultrasounds or other Referrals:  None Maternal Substance Abuse:  No Significant Maternal Medications:  None Significant Maternal Lab Results: None  Results for orders placed or performed during the hospital encounter of 11/11/16 (from the past 24 hour(s))  CBC   Collection Time: 11/11/16  8:58 AM  Result Value Ref Range   WBC 8.7 4.0 - 10.5 K/uL   RBC 4.59 3.87 - 5.11 MIL/uL    Hemoglobin 13.6 12.0 - 15.0 g/dL   HCT 16.138.8 09.636.0 - 04.546.0 %   MCV 84.5 78.0 - 100.0 fL   MCH 29.6 26.0 - 34.0 pg   MCHC 35.1 30.0 - 36.0 g/dL   RDW 40.914.5 81.111.5 - 91.415.5 %   Platelets 202 150 - 400 K/uL  Type and screen [facility]   Collection Time: 11/11/16  8:58 AM  Result Value Ref Range   ABO/RH(D) O NEG    Antibody Screen NEG    Sample Expiration 11/14/2016     Patient Active Problem List   Diagnosis Date Noted  . Post-dates pregnancy 11/11/2016  . Late prenatal care 08/10/2016  . Marijuana abuse 05/11/2016  . Supervision of normal pregnancy 05/08/2016  . Rh negative state in antepartum period 05/08/2016  . Thyroid enlarged 05/08/2016    Assessment: Miranda Drake is a 26 y.o. G3P1011 at 3764w1d here for IOL for postdats  #Labor: Start cytotec for cervical ripening. Will place FB as well #Pain: Per patient's request; may have epidural when in active labor #FWB: Cat I #ID:  GBS negative #MOF: breast #MOC:?IUD #Circ:  N/a (girl)  Kandra NicolasJulie P Degele 11/11/2016,

## 2016-11-11 NOTE — Anesthesia Procedure Notes (Signed)
Epidural Patient location during procedure: OB  Staffing Anesthesiologist: Boyd Litaker  Preanesthetic Checklist Completed: patient identified, site marked, surgical consent, pre-op evaluation, timeout performed, IV checked, risks and benefits discussed and monitors and equipment checked  Epidural Patient position: sitting Prep: site prepped and draped and DuraPrep Patient monitoring: continuous pulse ox and blood pressure Approach: midline Location: L3-L4 Injection technique: LOR air  Needle:  Needle type: Tuohy  Needle gauge: 17 G Needle length: 9 cm and 9 Needle insertion depth: 6 cm Catheter type: closed end flexible Catheter size: 19 Gauge Catheter at skin depth: 12 cm Test dose: negative  Assessment Events: blood not aspirated, injection not painful, no injection resistance, negative IV test and no paresthesia  Additional Notes Dosing of Epidural:  1st dose, through catheter .............................................  Xylocaine 40 mg  2nd dose, through catheter, after waiting 3 minutes.........Xylocaine 60 mg    As each dose occurred, patient was free of IV sx; and patient exhibited no evidence of SA injection.  Patient is more comfortable after epidural dosed. Please see RN's note for documentation of vital signs,and FHR which are stable.  Patient reminded not to try to ambulate with numb legs, and that an RN must be present when she attempts to get up.        

## 2016-11-11 NOTE — Anesthesia Preprocedure Evaluation (Signed)
Anesthesia Evaluation  Patient identified by MRN, date of birth, ID band Patient awake    Reviewed: Allergy & Precautions, H&P , Patient's Chart, lab work & pertinent test results  Airway Mallampati: II  TM Distance: >3 FB Neck ROM: full    Dental no notable dental hx.    Pulmonary former smoker,    Pulmonary exam normal breath sounds clear to auscultation       Cardiovascular Exercise Tolerance: Good  Rhythm:regular Rate:Normal     Neuro/Psych    GI/Hepatic   Endo/Other    Renal/GU      Musculoskeletal   Abdominal   Peds  Hematology   Anesthesia Other Findings   Reproductive/Obstetrics                             Anesthesia Physical Anesthesia Plan  ASA: II  Anesthesia Plan: Epidural   Post-op Pain Management:    Induction:   PONV Risk Score and Plan:   Airway Management Planned:   Additional Equipment:   Intra-op Plan:   Post-operative Plan:   Informed Consent: I have reviewed the patients History and Physical, chart, labs and discussed the procedure including the risks, benefits and alternatives for the proposed anesthesia with the patient or authorized representative who has indicated his/her understanding and acceptance.   Dental Advisory Given  Plan Discussed with:   Anesthesia Plan Comments: (Labs checked- platelets confirmed with RN in room. Fetal heart tracing, per RN, reported to be stable enough for sitting procedure. Discussed epidural, and patient consents to the procedure:  included risk of possible headache,backache, failed block, allergic reaction, and nerve injury. This patient was asked if she had any questions or concerns before the procedure started.)        Anesthesia Quick Evaluation  

## 2016-11-11 NOTE — Anesthesia Pain Management Evaluation Note (Signed)
  CRNA Pain Management Visit Note  [name], 26 y.o., female  "Hello [facility] am a [facility]. We have an anesthesia team available at all times to provide care throughout the hospital, including epidural management and anesthesia for C-section. I don't know your plan for the delivery whether it a natural birth, water birth, IV sedation, nitrous supplementation, doula or epidural, but we want to meet your pain goals."   1.Was your pain managed to your expectations on prior hospitalizations?   Yes   2.What is your expectation for pain management during this hospitalization?     Epidural  3.How can we help you reach that goal?   Record the patient's initial score and the patient's pain goal.   Pain: 0  Pain Goal: 6 [facility] wants you to be able to say your pain was always managed very well.  Laban EmperorMalinova,Melynda Krzywicki Hristova 11/11/2016

## 2016-11-11 NOTE — Progress Notes (Signed)
LABOR PROGRESS NOTE  Miranda Drake is a 26 y.o. G3P1011 at 7270w1d  admitted for IOL for postdates  Subjective: Patient doing well. Report she is feeling contractions now.   Objective: BP 125/70   Pulse 87   Temp 98.1 F (36.7 C) (Oral)   Resp 18   Ht 5\' 1"  (1.549 m)   Wt 181 lb (82.1 kg)   LMP 01/21/2016 (Approximate)   BMI 34.20 kg/m  or  Vitals:   11/11/16 0852 11/11/16 1444  BP: 125/70   Pulse: 87   Resp: 18 18  Temp: 98.1 F (36.7 C) 98.1 F (36.7 C)  TempSrc: Oral Oral  Weight: 181 lb (82.1 kg)   Height: 5\' 1"  (1.549 m)     Dilation: 2.5 Effacement (%): 40 Cervical Position: Posterior Station: -3 Presentation: Vertex Exam by:: dr Drake FHT: baseline rate 120, moderate varibility, +acel, no decel Toco: ctx q1-3  Labs: Lab Results  Component Value Date   WBC 8.7 11/11/2016   HGB 13.6 11/11/2016   HCT 38.8 11/11/2016   MCV 84.5 11/11/2016   PLT 202 11/11/2016    Patient Active Problem List   Diagnosis Date Noted  . Post-dates pregnancy 11/11/2016  . Late prenatal care 08/10/2016  . Marijuana abuse 05/11/2016  . Supervision of normal pregnancy 05/08/2016  . Rh negative state in antepartum period 05/08/2016  . Thyroid enlarged 05/08/2016    Assessment / Plan: 26 y.o. G3P1011 at 6770w1d here for IOL for postates  Labor: Having ctx s/p cytotec x 1. SVE as above. FB placed.  Fetal Wellbeing:  Cat I Pain Control:  IV pain meds. Planning on epidural when in active labor Anticipated MOD:  SVD  Miranda PearJulie P Degele, MD 11/11/2016, 3:37 PM

## 2016-11-12 ENCOUNTER — Encounter (HOSPITAL_COMMUNITY): Payer: Self-pay

## 2016-11-12 DIAGNOSIS — O480 Post-term pregnancy: Secondary | ICD-10-CM

## 2016-11-12 DIAGNOSIS — Z3A41 41 weeks gestation of pregnancy: Secondary | ICD-10-CM

## 2016-11-12 LAB — RPR: RPR Ser Ql: NONREACTIVE

## 2016-11-12 MED ORDER — SENNOSIDES-DOCUSATE SODIUM 8.6-50 MG PO TABS
2.0000 | ORAL_TABLET | ORAL | Status: DC
  Administered 2016-11-13: 2 via ORAL
  Filled 2016-11-12: qty 2

## 2016-11-12 MED ORDER — BENZOCAINE-MENTHOL 20-0.5 % EX AERO
1.0000 "application " | INHALATION_SPRAY | CUTANEOUS | Status: DC | PRN
  Administered 2016-11-12: 1 via TOPICAL
  Filled 2016-11-12: qty 56

## 2016-11-12 MED ORDER — SIMETHICONE 80 MG PO CHEW: 80.0000 mg | CHEWABLE_TABLET | ORAL | Status: DC | PRN

## 2016-11-12 MED ORDER — ONDANSETRON HCL 4 MG/2ML IJ SOLN: 4.0000 mg | INTRAMUSCULAR | Status: DC | PRN

## 2016-11-12 MED ORDER — WITCH HAZEL-GLYCERIN EX PADS: 1.0000 "application " | MEDICATED_PAD | CUTANEOUS | Status: DC | PRN

## 2016-11-12 MED ORDER — TETANUS-DIPHTH-ACELL PERTUSSIS 5-2.5-18.5 LF-MCG/0.5 IM SUSP: 0.5000 mL | Freq: Once | INTRAMUSCULAR | Status: DC

## 2016-11-12 MED ORDER — IBUPROFEN 600 MG PO TABS
600.0000 mg | ORAL_TABLET | Freq: Four times a day (QID) | ORAL | Status: DC
  Administered 2016-11-12 – 2016-11-13 (×6): 600 mg via ORAL
  Filled 2016-11-12 (×6): qty 1

## 2016-11-12 MED ORDER — DIPHENHYDRAMINE HCL 25 MG PO CAPS: 25.0000 mg | ORAL_CAPSULE | Freq: Four times a day (QID) | ORAL | Status: DC | PRN

## 2016-11-12 MED ORDER — RHO D IMMUNE GLOBULIN 1500 UNIT/2ML IJ SOSY
300.0000 ug | PREFILLED_SYRINGE | Freq: Once | INTRAMUSCULAR | Status: AC
  Administered 2016-11-12: 300 ug via INTRAVENOUS
  Filled 2016-11-12: qty 2

## 2016-11-12 MED ORDER — ZOLPIDEM TARTRATE 5 MG PO TABS: 5.0000 mg | ORAL_TABLET | Freq: Every evening | ORAL | Status: DC | PRN

## 2016-11-12 MED ORDER — ONDANSETRON HCL 4 MG PO TABS: 4.0000 mg | ORAL_TABLET | ORAL | Status: DC | PRN

## 2016-11-12 MED ORDER — DIBUCAINE 1 % RE OINT: 1.0000 "application " | TOPICAL_OINTMENT | RECTAL | Status: DC | PRN

## 2016-11-12 MED ORDER — COCONUT OIL OIL: 1.0000 "application " | TOPICAL_OIL | Status: DC | PRN

## 2016-11-12 MED ORDER — PRENATAL MULTIVITAMIN CH
1.0000 | ORAL_TABLET | Freq: Every day | ORAL | Status: DC
  Administered 2016-11-12 – 2016-11-13 (×2): 1 via ORAL
  Filled 2016-11-12 (×2): qty 1

## 2016-11-12 MED ORDER — ACETAMINOPHEN 325 MG PO TABS
650.0000 mg | ORAL_TABLET | ORAL | Status: DC | PRN
  Administered 2016-11-12 – 2016-11-13 (×2): 650 mg via ORAL
  Filled 2016-11-12 (×2): qty 2

## 2016-11-12 NOTE — Anesthesia Postprocedure Evaluation (Signed)
Anesthesia Post Note  [name]  Procedure(s) Performed: * No procedures listed *     Patient location during evaluation: Mother Baby Anesthesia Type: Epidural Level of consciousness: awake, awake and alert, oriented and patient cooperative Pain management: pain level controlled Vital Signs Assessment: post-procedure vital signs reviewed and stable Respiratory status: spontaneous breathing, nonlabored ventilation and respiratory function stable Cardiovascular status: stable Postop Assessment: no headache, no backache, no signs of nausea or vomiting and patient able to bend at knees Anesthetic complications: no    Last Vitals:  Vitals:   11/12/16 0515 11/12/16 0608  BP: 126/60 (!) 112/41  Pulse: (!) 108 98  Resp:  16  Temp:  37.6 C    Last Pain:  Vitals:   11/12/16 0608  TempSrc: Oral  PainSc: 0-No pain   Pain Goal: Patients Stated Pain Goal: 2 (11/11/16 1748)               Johnross Nabozny L

## 2016-11-12 NOTE — Progress Notes (Addendum)
Delivery Note  [name], Miranda Drake:  26 y.o. now [id number] at 3270w2d IOL 2/2 post dates at [id number] delivered a viable female  infant in cephalic, ROA  position. no nuchal cord,left anterior shoulder delivered with ease. 60 sec delayed cord clamping. Cord clamped x2 and cut. Placenta delivered spontaneously intact, with 3VC. Fundus firm on exam with massage and pitocin. Good hemostasis noted.  Anesthesia: Epidural Laceration: bilateral labial  Suture: n/a Good hemostasis noted. EBL: 100cc  Mom and baby recovering in LDR.     APGAR (5 MINS):  8 APGAR (10 MINS):  9 Weight: Pending skin to skin  Sponge and instrument count were correct x2. Placenta sent to L&D  Sullivan LoneBrannon L Inman, Medical Student 11/12/2016, 3:57 AM    OB FELLOW DELIVERY ATTESTATION  I was gloved and present for the delivery in its entirety and assisted with delivery, and I agree with the above delivery note.    Frederik PearJulie P Berline Semrad, MD OB Fellow 4:02 AM

## 2016-11-12 NOTE — Progress Notes (Signed)
LABOR PROGRESS NOTE  Miranda Drake is a 26 y.o. G3P1011 at 5646w2d  admitted for IOL 2/2 postdates  Subjective: Patient resting comfortably with epidural. Patient denies headache, vision changes, chest pain, shortness of breath, or RUQ pain.  Patient feeling contraction pressure intermittently.  Objective: BP (!) 114/50   Pulse 71   Temp (!) 97.5 F (36.4 C) (Axillary)   Resp 18   Ht 5\' 1"  (1.549 m)   Wt 82.1 kg (181 lb)   LMP 01/21/2016 (Approximate)   BMI 34.20 kg/m  or  Vitals:   11/11/16 2230 11/11/16 2300 11/11/16 2330 11/12/16 0000  BP: (!) 104/55 (!) 93/48 (!) 102/59 (!) 114/50  Pulse: 73 66 76 71  Resp:      Temp: 97.6 F (36.4 C)   (!) 97.5 F (36.4 C)  TempSrc: Oral   Axillary  Weight:      Height:        FHT : 125/mod/+ac/+dc, variable, intermittent with contraction, not prolonged Toco: Every 1-4 minutes Dilation: 7 Effacement (%): 80 Cervical Position: Middle Station: -1 Presentation: Vertex Exam by:: dr degele  Labs: Lab Results  Component Value Date   WBC 8.7 11/11/2016   HGB 13.6 11/11/2016   HCT 38.8 11/11/2016   MCV 84.5 11/11/2016   PLT 202 11/11/2016    Patient Active Problem List   Diagnosis Date Noted  . Post-dates pregnancy 11/11/2016  . Late prenatal care 08/10/2016  . Marijuana abuse 05/11/2016  . Supervision of normal pregnancy 05/08/2016  . Rh negative state in antepartum period 05/08/2016  . Thyroid enlarged 05/08/2016    Assessment / Plan: 26 y.o. G3P1011 at 346w2d here for IOL 2/2 postdates  Labor: Expectant management Fetal Wellbeing:  Cat 2, still reassuring, will intervene as necessary for continued variables Pain Control:  Epidural Anticipated MOD:  Vaginal  Conard NovakJoshua A Chevie Birkhead, MD 11/12/2016, 12:50 AM

## 2016-11-12 NOTE — Lactation Note (Signed)
This note was copied from a baby's chart. Lactation Consultation Note  Patient Name: Miranda Drake WGNFA'OToday's Date: 11/12/2016 Reason for consult: Initial assessment;Other (Comment) (O- mom and Baby is A+ + DAT )  Due to the jaundice level increasing recommended to mom to attempt to feed with feeding cues and at least Every 2-3 hours.  @ this consult - LC assisted mom to wake baby up since the baby has fed since 12 N and the Bili is elevated.  Baby woke up and LC assisted to latch on the right breast / football/ few intermittent swallows/ increased  With breast compressions. Baby has stools x3 and HNV.  LC discussed with mom how jaundice can make a baby potentially sleepy, and STS feedings are important every feeding.  Prior to latch - breast massage, hand express, latch with breast compressions until swallows, and then intermittent .  Discussed nutritive vs non - nutritive feeding patterns, and the importance of watching for hanging out when latched.  LC discussed Breast feeding findings with [name] and suggested if the baby continues to have sluggish feedings to set up a DEBP breast pump to enhance the milk coming in.  [facility] showed mom how to hand express.  Mother informed of post-discharge support and given phone number to the lactation department, including services for phone call assistance; out-patient appointments; and breastfeeding support group. List of other breastfeeding resources in the community given in the handout. Encouraged mother to call for problems or concerns related to breastfeeding.   Maternal Data Has patient been taught Hand Expression?: Yes Does the patient have breastfeeding experience prior to this delivery?: Yes  Feeding Feeding Type: Breast Fed Length of feed:  (still feeding at 8 mins , sluggish , few swallows )  LATCH Score/Interventions Latch: Grasps breast easily, tongue down, lips flanged, rhythmical sucking. Intervention(s): Adjust  position;Assist with latch;Breast compression;Breast massage  Audible Swallowing: A few with stimulation  Type of Nipple: Everted at rest and after stimulation  Comfort (Breast/Nipple): Soft / non-tender     Hold (Positioning): Assistance needed to correctly position infant at breast and maintain latch. Intervention(s): Breastfeeding basics reviewed;Support Pillows;Position options;Skin to skin  LATCH Score: 8  Lactation Tools Discussed/Used WIC Program: Yes ([facility] )   Consult Status Consult Status: Follow-up Date: 11/13/16 Follow-up type: In-patient    [name] 11/12/2016, 3:27 PM

## 2016-11-12 NOTE — Discharge Instructions (Signed)

## 2016-11-13 LAB — RH IG WORKUP (INCLUDES ABO/RH)
ABO/RH(D): O NEG
FETAL SCREEN: NEGATIVE
GESTATIONAL AGE(WKS): 41.2
Unit division: 0

## 2016-11-13 MED ORDER — IBUPROFEN 600 MG PO TABS: 600.0000 mg | ORAL_TABLET | Freq: Four times a day (QID) | ORAL | 0 refills | Status: DC

## 2016-11-13 NOTE — Discharge Summary (Signed)
OB Discharge Summary     Patient Name: Miranda Drake DOB: 02/01/1991 MRN: [id number]  Date of admission: 11/11/2016 Delivering MD: Sullivan LoneINMAN, BRANNON L   Date of discharge: 11/13/2016  Admitting diagnosis: 41WKS INDUCTION Intrauterine pregnancy: 6367w2d     Secondary diagnosis:  Principal Problem:   Normal spontaneous vaginal delivery Active Problems:   Post-dates pregnancy  Additional problems: none     Discharge diagnosis: Term Pregnancy Delivered                                                                                                Post partum procedures:none  Augmentation: Cytotec and Foley Balloon  Complications: None  Hospital course:  Induction of Labor With Vaginal Delivery   26 y.o. yo [id number] at 5567w2d was admitted to the hospital 11/11/2016 for induction of labor.  Indication for induction: Postdates. She had onset of labor s/p foley bulb and cytotec. She had an uncomplicated labor course as follows: Membrane Rupture Time/Date: 11:50 PM ,11/11/2016   Intrapartum Procedures: Episiotomy: None [1]                                         Lacerations:     Patient had delivery of a Viable infant.  Information for the patient's newborn:  Meininger, Girl Farris HasKasaundra [[id number]][[id number]]  Delivery Method: Vaginal, Spontaneous Delivery (Filed from Delivery Summary)   11/12/2016  Details of delivery can be found in separate delivery note.  Patient had a routine postpartum course. Patient is discharged home 11/13/16.  Physical exam  Vitals:   11/12/16 0737 11/12/16 1137 11/12/16 1945 11/13/16 0608  BP: (!) 116/59 (!) 122/59 123/71 (!) 114/54  Pulse: 82 81 73 65  Resp: 18 16 18 18   Temp: 98.9 F (37.2 C) 98.7 F (37.1 C) 98.3 F (36.8 C) 98.6 F (37 C)  TempSrc: Oral Oral Oral Oral  SpO2: 98% 97%    Weight:      Height:       General: alert, cooperative and no distress Lochia: appropriate Uterine Fundus: firm Incision: N/A DVT Evaluation: No evidence of DVT seen on  physical exam. No significant calf/ankle edema. Labs: Lab Results  Component Value Date   WBC 8.7 11/11/2016   HGB 13.6 11/11/2016   HCT 38.8 11/11/2016   MCV 84.5 11/11/2016   PLT 202 11/11/2016   CMP Latest Ref Rng & Units 05/26/2010  Glucose 70 - 99 mg/dL 956(L101(H)  BUN 6 - 23 mg/dL 10  Creatinine 0.4 - 1.2 mg/dL 8.750.92  Sodium 643135 - 329145 mEq/L 134(L)  Potassium 3.5 - 5.1 mEq/L 4.2  Chloride 96 - 112 mEq/L 104  CO2 19 - 32 mEq/L 24  Calcium 8.4 - 10.5 mg/dL 9.0    Discharge instruction: per After Visit Summary and "Baby and Me Booklet".  After visit meds:  Allergies as of 11/13/2016   No Known Allergies     Medication List    TAKE these medications   ibuprofen 600 MG tablet Commonly known as:  ADVIL,MOTRIN Take 1 tablet (600 mg total) by mouth every 6 (six) hours.   Prenatal Vitamins 0.8 MG tablet Take 1 tablet by mouth daily.       Diet: routine diet  Activity: Advance as tolerated. Pelvic rest for 6 weeks.   Outpatient follow up: 4-6 weeks Follow up Appt:Patient to call and schedule  Postpartum contraception: Nexplanon  Newborn Data: Live born female  Birth Weight: 7 lb 3.9 oz (3286 g) APGAR: 8, 9  Baby Feeding: Breast Disposition:home with mother   11/13/2016 Frederik PearJulie P Marvis Bakken, MD

## 2016-11-13 NOTE — Progress Notes (Signed)
Post discharge chart review completed.  

## 2016-11-13 NOTE — Lactation Note (Signed)
This note was copied from a baby's chart. Lactation Consultation Note  Patient Name: Miranda Drake's Date: 11/13/2016 Reason for consult: Follow-up assessment;Infant weight loss  Baby is 35 hours old , 5% weight loss,  Baby awake . LC assisted with positioning and obtaining depth at the breast.  Swallows noted , increased with breast compressions. STS feeding.  Due to jaundice level being watched closely due to the OA incompatibility and +DAT  LC recommended by 2 1/2 hours to check the baby's diaper , change if needed,   STS , and attempt to feed by 3 hours.  LC discussed nutritive vs non - nutritive feeding pattens, and to watch out for hanging out .  @ consult still feeding at 13 mins with swallows.     Maternal Data    Feeding Feeding Type: Breast Fed Length of feed:  (swallows noted )  LATCH Score Latch: Grasps breast easily, tongue down, lips flanged, rhythmical sucking.  Audible Swallowing: Spontaneous and intermittent  Type of Nipple: Everted at rest and after stimulation  Comfort (Breast/Nipple): Soft / non-tender  Hold (Positioning): Assistance needed to correctly position infant at breast and maintain latch.  LATCH Score: 9  Interventions Interventions: Breast feeding basics reviewed;Assisted with latch;Skin to skin;Breast massage;Hand express;Breast compression;Adjust position;Support pillows;Position options;Expressed milk;DEBP  Lactation Tools Discussed/Used Tools: Pump (per mom hasn't pumped in the last 24 hours - LC enccouraged afte4r feeding) Breast pump type: Double-Electric Breast Pump   Consult Status Consult Status: Follow-up Date: 11/14/16 Follow-up type: In-patient    Matilde SprangMargaret Ann Drayce Tawil 11/13/2016, 3:21 PM

## 2016-11-14 ENCOUNTER — Ambulatory Visit: Payer: Self-pay

## 2016-11-14 NOTE — Lactation Note (Signed)
This note was copied from a baby's chart. Lactation Consultation Note  Patient Name: Miranda Reyne DumasKasaundra Dalzell [id number]'s Date: 11/14/2016 Reason for consult: Follow-up assessment;Infant weight loss   Visited with Mom on day of discharge, baby 1554 hrs old.  Baby at 7.6% weight loss today.  Output 1 void/1 stool last 24 hrs.  Bili 10.6 this am. Mom holding baby at the breast, swaddled in blanket, without any pillow support.   Unwrapped baby, and encouraged Mom to place baby STS when breastfeeding, explaining benefits.  Switched from cradle hold, to football hold, adjusting pillow support.  Demonstrated hand expression, and return demo done, colostrum easily expressed.  Encouraged Mom to hand express prior to latching, and in between feedings to stimulate milk supply.  Baby opened widely, and latched deeply onto breast.  Demonstrated alternate breast compression, and identified rhythmic swallowing to Mom.  Encouraged Mom to offer both breasts at each feeding.  Recommended Mom try to feed baby every 2 hrs today. Mom states she had always planned to do both breast and bottle.  Formula in the room.  Talked about offering baby 10 ml after baby breastfeeds.  DEBP set up at bedside.  Mom has pumped once.  Showed Mom how to use pump parts as a manual double and single pump.  Encouraged hand expression and breast massage following breast feeding if baby is being supplemented. Encouraged STS, and cue based feedings, goal of 8-12 feedings per 24 hrs. Encouraged Mom to call prn for assistance.   Informed Mom of OP lactation support available to her.   To Pediatrician tomorrow.  Consult Status Consult Status: Complete Date: 11/14/16 Follow-up type: Call as needed    Judee ClaraSmith, Aalivia Mcgraw E 11/14/2016, 9:55 AM

## 2016-11-22 ENCOUNTER — Telehealth: Payer: Self-pay | Admitting: General Practice

## 2016-12-25 ENCOUNTER — Encounter: Payer: Self-pay | Admitting: Medical

## 2016-12-25 ENCOUNTER — Ambulatory Visit (INDEPENDENT_AMBULATORY_CARE_PROVIDER_SITE_OTHER): Payer: Medicaid Other | Admitting: Medical

## 2016-12-25 ENCOUNTER — Ambulatory Visit: Payer: Medicaid Other | Admitting: Advanced Practice Midwife

## 2016-12-25 NOTE — Progress Notes (Signed)
Subjective:     Miranda Drake is a 26 y.o. female who presents for a postpartum visit. She is 6 weeks postpartum following a spontaneous vaginal delivery. I have fully reviewed the prenatal and intrapartum course. The delivery was at 41.2 gestational weeks. Outcome: spontaneous vaginal delivery. Anesthesia: epidural. Postpartum course has been normal. Baby's course has been normal. Baby is feeding by bottle - Similac Advance. Bleeding no bleeding. Bowel function is normal. Bladder function is normal. Patient is sexually active. Contraception method is none. Postpartum depression screening: negative.  The following portions of the patient's history were reviewed and updated as appropriate: allergies, current medications, past family history, past medical history, past social history, past surgical history and problem list.  Review of Systems Pertinent items are noted in HPI.   Objective:    BP 128/70   Pulse 69   Ht 5\' 1"  (1.549 m)   Wt 175 lb 1.6 oz (79.4 kg)   LMP  (LMP Unknown)   BMI 33.08 kg/m   General:  alert and cooperative   Breasts:  not performed  Lungs: clear to auscultation bilaterally  Heart:  regular rate and rhythm, S1, S2 normal, no murmur, click, rub or gallop  Abdomen: soft, nontender, no masses   Vulva:  not evaluated  Vagina: not evaluated  Cervix:  not evaluated  Corpus: not examined  Adnexa:  not evaluated  Rectal Exam: Not performed.        Assessment:     Normal postpartum exam. Pap smear not done at today's visit. Last pap was 2018 and normal.   Plan:    1. Contraception: abstinence and or condoms, until Nexplanon can be placed 2. Follow up in: 2 weeks for Nexplanon insertion or sooner as needed.    Marny LowensteinWenzel, Julie N, PA-C 12/25/2016 4:23 PM

## 2016-12-25 NOTE — Patient Instructions (Signed)

## 2017-01-10 ENCOUNTER — Ambulatory Visit: Payer: Medicaid Other | Admitting: Medical

## 2017-01-30 ENCOUNTER — Ambulatory Visit: Payer: Medicaid Other | Admitting: Obstetrics & Gynecology

## 2017-01-31 ENCOUNTER — Ambulatory Visit: Payer: Medicaid Other | Admitting: Medical

## 2017-02-02 ENCOUNTER — Ambulatory Visit: Payer: Medicaid Other | Admitting: Medical

## 2017-02-28 ENCOUNTER — Ambulatory Visit: Payer: Medicaid Other | Admitting: Medical

## 2017-03-16 ENCOUNTER — Ambulatory Visit: Payer: Medicaid Other | Admitting: Medical

## 2017-04-20 DIAGNOSIS — R196 Halitosis: Secondary | ICD-10-CM | POA: Diagnosis not present

## 2017-04-20 DIAGNOSIS — J358 Other chronic diseases of tonsils and adenoids: Secondary | ICD-10-CM | POA: Diagnosis not present

## 2017-04-25 ENCOUNTER — Encounter: Payer: Self-pay | Admitting: Obstetrics & Gynecology

## 2017-04-25 ENCOUNTER — Ambulatory Visit (INDEPENDENT_AMBULATORY_CARE_PROVIDER_SITE_OTHER): Payer: Medicaid Other | Admitting: Obstetrics & Gynecology

## 2017-04-25 VITALS — BP 131/66 | HR 60 | Wt 188.0 lb

## 2017-04-25 DIAGNOSIS — Z23 Encounter for immunization: Secondary | ICD-10-CM | POA: Diagnosis not present

## 2017-04-25 DIAGNOSIS — Z3009 Encounter for other general counseling and advice on contraception: Secondary | ICD-10-CM | POA: Diagnosis not present

## 2017-04-25 DIAGNOSIS — Z3042 Encounter for surveillance of injectable contraceptive: Secondary | ICD-10-CM

## 2017-04-25 DIAGNOSIS — Z0000 Encounter for general adult medical examination without abnormal findings: Secondary | ICD-10-CM

## 2017-04-25 LAB — POCT PREGNANCY, URINE: PREG TEST UR: NEGATIVE

## 2017-04-25 MED ORDER — MEDROXYPROGESTERONE ACETATE 150 MG/ML IM SUSP
150.0000 mg | Freq: Once | INTRAMUSCULAR | Status: AC
  Administered 2017-04-25: 150 mg via INTRAMUSCULAR

## 2017-04-25 NOTE — Progress Notes (Signed)
Patient ID: Miranda Drake, female   DOB: 04/03/1991, 27 y.o.   MRN: [id number]  Chief Complaint  Patient presents with  . Contraception    HPI Miranda Drake is a 27 y.o. female.  Single P112 (565 month old and 29 year old son) here today because she would like to start depo provera. She used this for 2-3 shots. She stopped it due to her fears about brittle bones. She also has used the Paragard for about several years. She last had sex about a month ago. Her FOB is a [employer] architecttruck driver.  HPI  Past Medical History:  Diagnosis Date  . Medical history non-contributory     Past Surgical History:  Procedure Laterality Date  . NO PAST SURGERIES      Family History  Problem Relation Age of Onset  . Hyperlipidemia Mother   . Hypertension Mother   . Hyperlipidemia Sister     Social History Social History   Tobacco Use  . Smoking status: Former Smoker    Packs/day: 0.30    Types: Cigarettes  . Smokeless tobacco: Never Used  Substance Use Topics  . Alcohol use: Yes    Comment: occ- pt states none since early pregnancy  . Drug use: Yes    Types: Marijuana    Comment: none since 6wks    No Known Allergies  Current Outpatient Medications  Medication Sig Dispense Refill  . ibuprofen (ADVIL,MOTRIN) 600 MG tablet Take 1 tablet (600 mg total) by mouth every 6 (six) hours. (Patient not taking: Reported on 12/25/2016) 30 tablet 0  . Prenatal Multivit-Min-Fe-FA (PRENATAL VITAMINS) 0.8 MG tablet Take 1 tablet by mouth daily. (Patient not taking: Reported on 04/25/2017) 30 tablet 12   No current facility-administered medications for this visit.     Review of Systems Review of Systems  Blood pressure 131/66, pulse 60, weight 188 lb (85.3 kg), last menstrual period 04/15/2017, not currently breastfeeding.  Physical Exam Physical Exam  Data Reviewed Component 30mo ago  Adequacy Satisfactory for evaluation endocervical/transformation zone component PRESENT.   Diagnosis NEGATIVE FOR  INTRAEPITHELIAL LESIONS OR MALIGNANCY.   Chlamydia Negative   Comment: Normal Reference Range - Negative  Neisseria gonorrhea Negative   Comment: Normal Reference Range - Negative  Material Submitted CervicoVaginal Pap [ThinPrep Imaged]   Resulting Agency [facility]      Specimen Collected: 05/08/16 00:       Assessment   preventative care Contraception     Plan   Start Gardasil today Flu vaccine today Depo provera today        Myrella Fahs C Taysen Bushart 04/25/2017, 3:23 PM

## 2017-04-25 NOTE — Progress Notes (Signed)
Pt wants depo contraceptive

## 2017-10-04 ENCOUNTER — Ambulatory Visit: Payer: Medicaid Other | Admitting: General Practice

## 2017-10-04 VITALS — BP 132/67 | HR 65 | Ht 61.0 in | Wt 165.0 lb

## 2017-10-04 DIAGNOSIS — Z3042 Encounter for surveillance of injectable contraceptive: Secondary | ICD-10-CM | POA: Diagnosis present

## 2017-10-04 DIAGNOSIS — Z3202 Encounter for pregnancy test, result negative: Secondary | ICD-10-CM

## 2017-10-04 DIAGNOSIS — IMO0001 Reserved for inherently not codable concepts without codable children: Secondary | ICD-10-CM

## 2017-10-04 LAB — POCT PREGNANCY, URINE: PREG TEST UR: NEGATIVE

## 2017-10-04 MED ORDER — MEDROXYPROGESTERONE ACETATE 150 MG/ML IM SUSP
150.0000 mg | Freq: Once | INTRAMUSCULAR | Status: AC
  Administered 2017-10-04: 150 mg via INTRAMUSCULAR

## 2017-10-04 NOTE — Progress Notes (Signed)
[facility] Dep[facility]-Prover[facility]  Injection.  Injection administered without complication. Patient will return in 3 months for next injection.  Marylynn PearsonCarrie Hillman, RN 10/04/2017  4:40 PM

## 2017-10-08 NOTE — Progress Notes (Signed)
I have reviewed the chart and agree with nursing staff's documentation of this patient's encounter.  Sharone Picchi, MD 10/08/2017 12:43 PM    

## 2017-10-11 DIAGNOSIS — J029 Acute pharyngitis, unspecified: Secondary | ICD-10-CM | POA: Diagnosis not present

## 2017-10-22 ENCOUNTER — Ambulatory Visit: Payer: Medicaid Other | Admitting: Advanced Practice Midwife

## 2017-10-25 DIAGNOSIS — J028 Acute pharyngitis due to other specified organisms: Secondary | ICD-10-CM | POA: Diagnosis not present

## 2017-11-26 ENCOUNTER — Ambulatory Visit (INDEPENDENT_AMBULATORY_CARE_PROVIDER_SITE_OTHER): Payer: Medicaid Other | Admitting: Advanced Practice Midwife

## 2017-11-26 ENCOUNTER — Encounter: Payer: Self-pay | Admitting: Advanced Practice Midwife

## 2017-11-26 VITALS — BP 135/68 | HR 78 | Ht 61.5 in | Wt 165.7 lb

## 2017-11-26 DIAGNOSIS — Z01419 Encounter for gynecological examination (general) (routine) without abnormal findings: Secondary | ICD-10-CM

## 2017-11-26 DIAGNOSIS — Z23 Encounter for immunization: Secondary | ICD-10-CM

## 2017-11-26 DIAGNOSIS — Z0000 Encounter for general adult medical examination without abnormal findings: Secondary | ICD-10-CM | POA: Diagnosis not present

## 2017-11-26 DIAGNOSIS — F1210 Cannabis abuse, uncomplicated: Secondary | ICD-10-CM

## 2017-11-26 LAB — POCT URINALYSIS DIP (DEVICE)
Bilirubin Urine: NEGATIVE
GLUCOSE, UA: NEGATIVE mg/dL
Leukocytes, UA: NEGATIVE
Nitrite: NEGATIVE
PROTEIN: NEGATIVE mg/dL
Specific Gravity, Urine: 1.025 (ref 1.005–1.030)
Urobilinogen, UA: 0.2 mg/dL (ref 0.0–1.0)
pH: 6 (ref 5.0–8.0)

## 2017-11-26 MED ORDER — VALACYCLOVIR HCL 1 G PO TABS: 1000.0000 mg | ORAL_TABLET | Freq: Every day | ORAL | 5 refills | Status: DC

## 2017-11-26 MED ORDER — NITROFURANTOIN MONOHYD MACRO 100 MG PO CAPS: 100.0000 mg | ORAL_CAPSULE | Freq: Two times a day (BID) | ORAL | 0 refills | Status: DC

## 2017-11-26 NOTE — Progress Notes (Signed)
Pt is here for annual Gyn exam. She states that 1 week ago she was having symptoms of UTI (frequency, burning, small amount of blood). She took some amoxicillin that she had from a previous prescription which helped the symptoms somewhat however they have not stopped completely.  Pt also states she has a fever blister on her lip and requests Rx. Pt had Gardasil #1 injection on 04/25/17 but did not get scheduled for 2nd and 3rd injections.

## 2017-11-26 NOTE — Progress Notes (Signed)
GYNECOLOGY ANNUAL PREVENTATIVE CARE ENCOUNTER NOTE  Subjective:   Miranda DumasKasaundra Pund is a 27 y.o. [phone] female here for a routine annual gynecologic exam.  Current complaints: dysuria.   Denies abnormal vaginal bleeding, discharge, pelvic pain, problems with intercourse or other gynecologic concerns. She has had dysuria and took several doses of a leftover antibiotic. This did help, but sx have returned. She also has a cold sore on her lip. She would like something for that.    Gynecologic History No LMP recorded. Contraception: Depo-Provera injections Last Pap: 04/2016. Results were: normal Last mammogram: NA. Results were: NA  Obstetric History OB History  Gravida Para Term Preterm AB Living  3 2 2   1 2   SAB TAB Ectopic Multiple Live Births  1     0 2    # Outcome Date GA Lbr Len/2nd Weight Sex Delivery Anes PTL Lv  3 Term 11/12/16 3378w2d 06:40 / 00:13 7 lb 3.9 oz (3.286 kg) F Vag-Spont EPI  LIV  2 Term 01/31/05   7 lb 11 oz (3.487 kg) M Vag-Spont   LIV  1 SAB             Past Medical History:  Diagnosis Date  . Medical history non-contributory     Past Surgical History:  Procedure Laterality Date  . NO PAST SURGERIES      Current Outpatient Medications on File Prior to Visit  Medication Sig Dispense Refill  . ibuprofen (ADVIL,MOTRIN) 600 MG tablet Take 1 tablet (600 mg total) by mouth every 6 (six) hours. (Patient not taking: Reported on 12/25/2016) 30 tablet 0  . Prenatal Multivit-Min-Fe-FA (PRENATAL VITAMINS) 0.8 MG tablet Take 1 tablet by mouth daily. (Patient not taking: Reported on 04/25/2017) 30 tablet 12   No current facility-administered medications on file prior to visit.     No Known Allergies  Social History   Socioeconomic History  . Marital status: Single    Spouse name: Not on file  . Number of children: Not on file  . Years of education: Not on file  . Highest education level: Not on file  Occupational History  . Not on file  Social Needs  .  Financial resource strain: Not on file  . Food insecurity:    Worry: Not on file    Inability: Not on file  . Transportation needs:    Medical: Not on file    Non-medical: Not on file  Tobacco Use  . Smoking status: Former Smoker    Packs/day: 0.30    Types: Cigarettes  . Smokeless tobacco: Never Used  Substance and Sexual Activity  . Alcohol use: Yes    Comment: occ- pt states none since early pregnancy  . Drug use: Not Currently    Types: Marijuana    Comment: none since 6wks  . Sexual activity: Yes    Birth control/protection: Injection    Comment: pregnant  Lifestyle  . Physical activity:    Days per week: Not on file    Minutes per session: Not on file  . Stress: Not on file  Relationships  . Social connections:    Talks on phone: Not on file    Gets together: Not on file    Attends religious service: Not on file    Active member of club or organization: Not on file    Attends meetings of clubs or organizations: Not on file    Relationship status: Not on file  . Intimate partner violence:  Fear of current or ex partner: Not on file    Emotionally abused: Not on file    Physically abused: Not on file    Forced sexual activity: Not on file  Other Topics Concern  . Not on file  Social History Narrative  . Not on file    Family History  Problem Relation Age of Onset  . Hyperlipidemia Mother   . Hypertension Mother   . Hyperlipidemia Sister     The following portions of the patient's history were reviewed and updated as appropriate: allergies, current medications, past family history, past medical history, past social history, past surgical history and problem list.  Review of Systems Pertinent items noted in HPI and remainder of comprehensive ROS otherwise negative.   Objective:  BP 135/68   Pulse 78   Ht 5' 1.5" (1.562 m)   Wt 165 lb 11.2 oz (75.2 kg)   BMI 30.80 kg/m  CONSTITUTIONAL: Well-developed, well-nourished female in no acute distress.   HENT:  Normocephalic, atraumatic, External right and left ear normal. Oropharynx is clear and moist EYES: Conjunctivae and EOM are normal. Pupils are equal, round, and reactive to light. No scleral icterus.  NECK: Normal range of motion, supple, no masses.  Normal thyroid.  SKIN: Skin is warm and dry. No rash noted. Not diaphoretic. No erythema. No pallor. NEUROLOGIC: Alert and oriented to person, place, and time. Normal reflexes, muscle tone coordination. No cranial nerve deficit noted. PSYCHIATRIC: Normal mood and affect. Normal behavior. Normal judgment and thought content. CARDIOVASCULAR: Normal heart rate noted, regular rhythm RESPIRATORY: Clear to auscultation bilaterally. Effort and breath sounds normal, no problems with respiration noted. BREASTS: Symmetric in size. No masses, skin changes, nipple drainage, or lymphadenopathy. ABDOMEN: Soft, normal bowel sounds, no distention noted.  No tenderness, rebound or guarding.  PELVIC: deferred MUSCULOSKELETAL: Normal range of motion. No tenderness.  No cyanosis, clubbing, or edema.  2+ distal pulses.   Assessment and Plan:  1. Well woman exam - Routine care - Pap not indicated today  - Continue depo    2. Vaccine for human papilloma virus (HPV) types 6, 11, 16, and 18 administered - Per uptodate if the vaccine series is interrupted for any length of time it can be resumed. Will administer 2nd in series today and patient to return in 4 months for final injection.   Will follow up results of pap smear and manage accordingly. Mammogram scheduled Routine preventative health maintenance measures emphasized. Please refer to After Visit Summary for other counseling recommendations.

## 2017-11-28 LAB — URINE CULTURE

## 2017-12-20 ENCOUNTER — Ambulatory Visit (INDEPENDENT_AMBULATORY_CARE_PROVIDER_SITE_OTHER): Payer: Medicaid Other | Admitting: *Deleted

## 2017-12-20 VITALS — BP 130/72 | HR 82 | Wt 165.5 lb

## 2017-12-20 DIAGNOSIS — Z3042 Encounter for surveillance of injectable contraceptive: Secondary | ICD-10-CM | POA: Diagnosis not present

## 2017-12-20 MED ORDER — MEDROXYPROGESTERONE ACETATE 150 MG/ML IM SUSP
150.0000 mg | Freq: Once | INTRAMUSCULAR | Status: AC
  Administered 2017-12-20: 150 mg via INTRAMUSCULAR

## 2017-12-20 NOTE — Progress Notes (Signed)
[facility] here for Depo-Provera  Injection.  Injection administered without complication.  Pt tolerated well.  Patient will return in 3 months for next injection.  Osvaldo Human, RN 12/20/2017  3:34 PM

## 2017-12-23 NOTE — Progress Notes (Signed)
I have reviewed the chart and agree with nursing staff's documentation of this patient's encounter.  Boyde Grieco, MD 12/23/2017 4:08 PM    

## 2017-12-25 NOTE — Progress Notes (Signed)
I have reviewed the chart and agree with nursing staff's documentation of this patient's encounter.  Maury Bing, MD 12/25/2017 9:06 AM

## 2018-01-24 ENCOUNTER — Telehealth: Payer: Self-pay | Admitting: Family Medicine

## 2018-01-24 NOTE — Telephone Encounter (Signed)
I called pt and discussed her concern. She stated that she does not use marijuana and would like to have that removed from her medical record. I explained that she had tested positive on 05/08/16 during her pregnancy and I cannot remove the information because it is part of her actual  history. I reviewed chart and advised pt that it was documented during her office visit on 11/26/17 that she no longer uses marijuana and hasn't since early in her pregnancy 2018. I advised pt that I would add that information to the diagnosis on her problem list. Pt voiced understanding and was pleased with my response.

## 2018-01-24 NOTE — Telephone Encounter (Signed)
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 Wants to know can someone call her back about this and have it removed.

## 2018-03-07 ENCOUNTER — Ambulatory Visit: Payer: Medicaid Other

## 2018-04-18 ENCOUNTER — Ambulatory Visit (INDEPENDENT_AMBULATORY_CARE_PROVIDER_SITE_OTHER): Payer: Medicaid Other | Admitting: Family Medicine

## 2018-04-18 ENCOUNTER — Telehealth: Payer: Self-pay | Admitting: Family Medicine

## 2018-04-18 VITALS — BP 119/70 | HR 91

## 2018-04-18 DIAGNOSIS — Z3202 Encounter for pregnancy test, result negative: Secondary | ICD-10-CM

## 2018-04-18 DIAGNOSIS — Z3042 Encounter for surveillance of injectable contraceptive: Secondary | ICD-10-CM | POA: Diagnosis not present

## 2018-04-18 LAB — POCT PREGNANCY, URINE: PREG TEST UR: NEGATIVE

## 2018-04-18 MED ORDER — MEDROXYPROGESTERONE ACETATE 150 MG/ML IM SUSP
150.0000 mg | Freq: Once | INTRAMUSCULAR | Status: AC
Start: 2018-04-18 — End: 2018-04-18
  Administered 2018-04-18: 150 mg via INTRAMUSCULAR

## 2018-04-18 NOTE — Telephone Encounter (Signed)
The patient called in with general questions for birth control (depo). Informed of our walk-in clinic as she wanted to be seen as soon as possible.

## 2018-04-18 NOTE — Progress Notes (Signed)
Here to get depo. Missed last appointment. Last depo was 12/20/17 and was due 03/07/18-03/21/18. Last unprotected intercourse was last night. Discussed with Dr. Earlene Plater and ordered may give depo-provera today. UPT today.

## 2018-04-19 NOTE — Progress Notes (Signed)
Agree with RN documentation and plan of care.  Mahmud Keithly S. Debbi Strandberg, DO OB/GYN Fellow  

## 2018-07-05 ENCOUNTER — Ambulatory Visit (INDEPENDENT_AMBULATORY_CARE_PROVIDER_SITE_OTHER): Payer: Medicaid Other | Admitting: *Deleted

## 2018-07-05 ENCOUNTER — Other Ambulatory Visit: Payer: Self-pay

## 2018-07-05 VITALS — BP 108/65 | HR 72 | Temp 98.1°F

## 2018-07-05 DIAGNOSIS — Z3042 Encounter for surveillance of injectable contraceptive: Secondary | ICD-10-CM | POA: Diagnosis not present

## 2018-07-05 MED ORDER — MEDROXYPROGESTERONE ACETATE 150 MG/ML IM SUSP
150.0000 mg | Freq: Once | INTRAMUSCULAR | Status: AC
  Administered 2018-07-05: 150 mg via INTRAMUSCULAR

## 2018-07-05 NOTE — Progress Notes (Signed)
[facility] here for Depo-Provera  Injection.  Injection administered without complication. Patient will return in 3 months for next injection.  Linda,RN 07/05/2018  11:12 AM

## 2018-09-20 ENCOUNTER — Ambulatory Visit: Payer: Medicaid Other

## 2018-10-14 ENCOUNTER — Other Ambulatory Visit: Payer: Self-pay

## 2018-10-14 ENCOUNTER — Ambulatory Visit (INDEPENDENT_AMBULATORY_CARE_PROVIDER_SITE_OTHER): Payer: PRIVATE HEALTH INSURANCE

## 2018-10-14 VITALS — Wt 199.3 lb

## 2018-10-14 DIAGNOSIS — Z3042 Encounter for surveillance of injectable contraceptive: Secondary | ICD-10-CM

## 2018-10-14 MED ORDER — MEDROXYPROGESTERONE ACETATE 150 MG/ML IM SUSP
150.0000 mg | Freq: Once | INTRAMUSCULAR | Status: AC
  Administered 2018-10-14: 150 mg via INTRAMUSCULAR

## 2018-10-14 NOTE — Progress Notes (Signed)
Pt here today for depo injections

## 2018-10-14 NOTE — Progress Notes (Signed)
Pt here for depo provera , administered with no complications.

## 2018-12-30 ENCOUNTER — Ambulatory Visit (INDEPENDENT_AMBULATORY_CARE_PROVIDER_SITE_OTHER): Payer: PRIVATE HEALTH INSURANCE | Admitting: Lactation Services

## 2018-12-30 ENCOUNTER — Other Ambulatory Visit: Payer: Self-pay

## 2018-12-30 DIAGNOSIS — Z3042 Encounter for surveillance of injectable contraceptive: Secondary | ICD-10-CM | POA: Diagnosis not present

## 2018-12-30 MED ORDER — MEDROXYPROGESTERONE ACETATE 150 MG/ML IM SUSP
150.0000 mg | Freq: Once | INTRAMUSCULAR | Status: AC
  Administered 2018-12-30: 150 mg via INTRAMUSCULAR

## 2018-12-30 NOTE — Progress Notes (Signed)
[place] here for Depo-Provera  Injection.  Injection administered without complication. Patient will return in 3 months for next injection. Pt tolerated well. Pt to return Nov 30-Dec [place], [place] 12/30/2018  3:15 PM

## 2019-03-17 ENCOUNTER — Other Ambulatory Visit: Payer: Self-pay

## 2019-03-17 ENCOUNTER — Ambulatory Visit (INDEPENDENT_AMBULATORY_CARE_PROVIDER_SITE_OTHER): Payer: PRIVATE HEALTH INSURANCE

## 2019-03-17 VITALS — BP 115/57 | HR 63 | Wt 181.3 lb

## 2019-03-17 DIAGNOSIS — Z3042 Encounter for surveillance of injectable contraceptive: Secondary | ICD-10-CM | POA: Diagnosis not present

## 2019-03-17 MED ORDER — MEDROXYPROGESTERONE ACETATE 150 MG/ML IM SUSP
150.0000 mg | Freq: Once | INTRAMUSCULAR | Status: AC
  Administered 2019-03-17: 150 mg via INTRAMUSCULAR

## 2019-03-17 NOTE — Progress Notes (Signed)
Chart reviewed - agree with RN documentation.   

## 2019-03-17 NOTE — Progress Notes (Signed)
[place] here for Depo-Provera  Injection.  Injection administered without complication. Patient will return in 3 months for next injection.  Verdell Carmine, RN 03/17/2019  1:14 PM

## 2019-05-07 DIAGNOSIS — N939 Abnormal uterine and vaginal bleeding, unspecified: Secondary | ICD-10-CM | POA: Diagnosis not present

## 2019-05-07 DIAGNOSIS — Z3202 Encounter for pregnancy test, result negative: Secondary | ICD-10-CM | POA: Diagnosis not present

## 2019-06-03 ENCOUNTER — Ambulatory Visit: Payer: PRIVATE HEALTH INSURANCE

## 2019-06-12 ENCOUNTER — Other Ambulatory Visit: Payer: Self-pay

## 2019-06-12 ENCOUNTER — Ambulatory Visit (INDEPENDENT_AMBULATORY_CARE_PROVIDER_SITE_OTHER): Payer: PRIVATE HEALTH INSURANCE

## 2019-06-12 VITALS — BP 120/80 | HR 90 | Wt 174.6 lb

## 2019-06-12 DIAGNOSIS — Z3042 Encounter for surveillance of injectable contraceptive: Secondary | ICD-10-CM

## 2019-06-12 MED ORDER — MEDROXYPROGESTERONE ACETATE 150 MG/ML IM SUSP
150.0000 mg | Freq: Once | INTRAMUSCULAR | Status: AC
  Administered 2019-06-12: 10:00:00 150 mg via INTRAMUSCULAR

## 2019-06-12 NOTE — Progress Notes (Signed)
[facility] here for Depo-Provera  Injection.  Injection administered without complication. Patient will return in 3 months for next injection.  Pt reports that she had an episode of clotting that lasted over a week.  She stated that she went 3 months without a period, she had intercourse, and that is when the clots started to happen.  I explained to the pt that it is called withdrawal bleeding and it can be normal to have some clots after not having a period for several months.  Pt verbalized understanding with no further questions.   Ralene Bathe, RN 06/12/2019  10:05 AM

## 2019-06-18 NOTE — Progress Notes (Signed)
Chart reviewed for nurse visit. Agree with plan of care.   Duane Lope, NP 06/18/2019 3:23 PM

## 2019-07-07 ENCOUNTER — Other Ambulatory Visit: Payer: Self-pay

## 2019-07-07 ENCOUNTER — Emergency Department (HOSPITAL_BASED_OUTPATIENT_CLINIC_OR_DEPARTMENT_OTHER)
Admission: EM | Admit: 2019-07-07 | Discharge: 2019-07-07 | Disposition: A | Discharge status: Home / Self Care | Payer: PRIVATE HEALTH INSURANCE | Attending: Emergency Medicine | Admitting: Emergency Medicine

## 2019-07-07 ENCOUNTER — Encounter (HOSPITAL_BASED_OUTPATIENT_CLINIC_OR_DEPARTMENT_OTHER): Payer: Self-pay | Admitting: *Deleted

## 2019-07-07 DIAGNOSIS — Z113 Encounter for screening for infections with a predominantly sexual mode of transmission: Secondary | ICD-10-CM | POA: Insufficient documentation

## 2019-07-07 DIAGNOSIS — Z87891 Personal history of nicotine dependence: Secondary | ICD-10-CM | POA: Diagnosis not present

## 2019-07-07 DIAGNOSIS — N899 Noninflammatory disorder of vagina, unspecified: Secondary | ICD-10-CM | POA: Diagnosis present

## 2019-07-07 DIAGNOSIS — Z711 Person with feared health complaint in whom no diagnosis is made: Secondary | ICD-10-CM

## 2019-07-07 DIAGNOSIS — R1110 Vomiting, unspecified: Secondary | ICD-10-CM | POA: Insufficient documentation

## 2019-07-07 LAB — URINALYSIS, ROUTINE W REFLEX MICROSCOPIC
Bilirubin Urine: NEGATIVE
Glucose, UA: NEGATIVE mg/dL
Hgb urine dipstick: NEGATIVE
Ketones, ur: NEGATIVE mg/dL
Leukocytes,Ua: NEGATIVE
Nitrite: NEGATIVE
Protein, ur: NEGATIVE mg/dL
Specific Gravity, Urine: 1.02 (ref 1.005–1.030)
pH: 8 (ref 5.0–8.0)

## 2019-07-07 LAB — WET PREP, GENITAL
Sperm: NONE SEEN
Trich, Wet Prep: NONE SEEN
Yeast Wet Prep HPF POC: NONE SEEN

## 2019-07-07 LAB — PREGNANCY, URINE: Preg Test, Ur: NEGATIVE

## 2019-07-07 MED ORDER — ONDANSETRON 4 MG PO TBDP: 4.0000 mg | ORAL_TABLET | Freq: Three times a day (TID) | ORAL | 0 refills | Status: DC | PRN

## 2019-07-07 MED FILL — ONDANSETRON ODT 4 MG TABLET: 4 | 7 days supply | Qty: 20 | Fill #0

## 2019-07-07 NOTE — ED Triage Notes (Signed)
Vaginal tingling.

## 2019-07-07 NOTE — Discharge Instructions (Addendum)
Please follow-up with the health department to schedule an appointment to have a women's health exam.  You may also call the [facility] which I provide information for above.

## 2019-07-07 NOTE — ED Provider Notes (Signed)
[facility] Provider Note   CSN: [id number] Arrival date & time: 07/07/19  1219     History No chief complaint on file.   Miranda Drake is a 29 y.o. female.  HPI  Patient is a 29 year old female with no significant past medical history presented for concern for sexually transmitted disease.  She states that she wiped her vagina with toilet paper in a public restroom in the emergency department approximately 1 month ago and states that she felt a "cool sensation down there "and was concerned that she may have contracted a sexually transmitted disease as well.  She states that she has had no vaginal discharge, no frequency, urgency, dyspareunia, dysuria, hematuria, pelvic pain, abdominal pain.  She states that she did have 2 episodes of vomiting this morning.  She states that this may be related to the food that she ate this morning.  She states that she has had no nausea currently.  Denies any fevers, chills, lightheadedness, dizziness, weakness, fatigue.     Past Medical History:  Diagnosis Date  . Medical history non-contributory     Patient Active Problem List   Diagnosis Date Noted  . Normal spontaneous vaginal delivery 11/13/2016  . Marijuana abuse 05/11/2016  . 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 NO PAST SURGERIES       OB History    Gravida  3   Para  2   Term  2   Preterm      AB  1   Living  2     SAB  1   TAB      Ectopic      Multiple  0   Live Births  2           Family History  Problem Relation Age of Onset  . Hyperlipidemia Mother   . Hypertension Mother   . Hyperlipidemia Sister     Social History   Tobacco Use  . Smoking status: Former Smoker    Packs/day: 0.30    Types: Cigarettes  . Smokeless tobacco: Never Used  Substance Use Topics  . Alcohol use: Yes    Comment: occ- pt states none since early pregnancy  . Drug use: Not Currently   Types: Marijuana    Comment: none since 6wks    Home Medications Prior to Admission medications   Medication Sig Start Date End Date Taking? Authorizing Provider  ibuprofen (ADVIL,MOTRIN) 600 MG tablet Take 1 tablet (600 mg total) by mouth every 6 (six) hours. 11/13/16   Degele, Jenne Pane, MD  nitrofurantoin, macrocrystal-monohydrate, (MACROBID) 100 MG capsule Take 1 capsule (100 mg total) by mouth 2 (two) times daily. 11/26/17   Tresea Mall, CNM  ondansetron (ZOFRAN ODT) 4 MG disintegrating tablet Take 1 tablet (4 mg total) by mouth every 8 (eight) hours as needed for nausea or vomiting. 07/07/19   Tedd Sias, PA  Prenatal Multivit-Min-Fe-FA (PRENATAL VITAMINS) 0.8 MG tablet Take 1 tablet by mouth daily. 06/06/16   Starr Lake, CNM  valACYclovir (VALTREX) 1000 MG tablet Take 1 tablet (1,000 mg total) by mouth daily. Patient not taking: Reported on 04/18/2018 11/26/17   Tresea Mall, CNM    Allergies    Patient has no known allergies.  Review of Systems   Review of Systems  Constitutional: Negative for fever.  HENT: Negative for congestion.   Cardiovascular: Negative for chest pain.  Gastrointestinal: Positive for nausea  and vomiting. Negative for abdominal distention.  Genitourinary: Negative for dysuria, flank pain, hematuria, menstrual problem, pelvic pain, vaginal bleeding, vaginal discharge and vaginal pain.  Neurological: Negative for dizziness and headaches.    Physical Exam Updated Vital Signs BP 127/74   Pulse 64   Temp 97.9 F (36.6 C) (Oral)   Resp 20   Ht 5\' 2"  (1.575 m)   Wt 81.2 kg   SpO2 100%   BMI 32.74 kg/m   Physical Exam Vitals and nursing note reviewed.  Constitutional:      General: She is not in acute distress.    Comments: Patient is pleasant, 29 year old female in no acute distress.  Sitting comfortably in bed.  Is not ill-appearing or diaphoretic.  Able to answer questions appropriately follow commands.  Able to ambulate  without difficulty.  HENT:     Head: Normocephalic and atraumatic.     Nose: Nose normal.     Mouth/Throat:     Mouth: Mucous membranes are moist.  Eyes:     General: No scleral icterus. Cardiovascular:     Rate and Rhythm: Normal rate and regular rhythm.     Pulses: Normal pulses.     Heart sounds: Normal heart sounds.  Pulmonary:     Effort: Pulmonary effort is normal. No respiratory distress.     Breath sounds: No wheezing.  Abdominal:     Palpations: Abdomen is soft.     Tenderness: There is no abdominal tenderness. There is no right CVA tenderness, left CVA tenderness, guarding or rebound.  Genitourinary:    Comments: Vulva without lesions or abnormality Vaginal canal without abnormal discharge or lesion Cervix appears normal, is closed No adnexal tenderness or CMT Musculoskeletal:     Cervical back: Normal range of motion.     Right lower leg: No edema.     Left lower leg: No edema.  Skin:    General: Skin is warm and dry.     Capillary Refill: Capillary refill takes less than 2 seconds.  Neurological:     Mental Status: She is alert. Mental status is at baseline.  Psychiatric:        Mood and Affect: Mood normal.        Behavior: Behavior normal.     ED Results / Procedures / Treatments   Labs (all labs ordered are listed, but only abnormal results are displayed) Labs Reviewed  WET PREP, GENITAL - Abnormal; Notable for the following components:      Result Value   Clue Cells Wet Prep HPF POC PRESENT (*)    WBC, Wet Prep HPF POC MANY (*)    All other components within normal limits  PREGNANCY, URINE  URINALYSIS, ROUTINE W REFLEX MICROSCOPIC  GC/CHLAMYDIA PROBE AMP ([place]) NOT AT [facility]    EKG None  Radiology No results found.  Procedures Procedures (including critical care time)  Medications Ordered in ED Medications - No data to display  ED Course  I have reviewed the triage vital signs and the nursing notes.  Pertinent labs & imaging  results that were available during my care of the patient were reviewed by me and considered in my medical decision making (see chart for details).    MDM Rules/Calculators/A&P                      Patient is a 29 year old female with no significant past medical history presenting today with concern for STD.  Physical exam is unremarkable.  No CVA tenderness, no abdominal TTP, no CMT or adnexal tenderness on pelvic.  Vital signs are within normal limits.  Patient is in no acute distress.  Patient denies any vaginal discharge.  Wet prep shows clue cells present.  Discussed with patient at shared decision-making conversation she prefer to defer treatment of BV at this time.  I believe this is reasonable as she denies any vaginal discharge.  Patient will follow up with her OB/GYN.  I recommended the patient go to health department for future STD concerns.   Final Clinical Impression(s) / ED Diagnoses Final diagnoses:  Concern about STD in female without diagnosis    Rx / DC Orders ED Discharge Orders         Ordered    ondansetron (ZOFRAN ODT) 4 MG disintegrating tablet  Every 8 hours PRN     07/07/19 1250           Gailen Shelter, [place] 07/07/19 1438    Milagros Loll, MD 07/09/19 [phone]

## 2019-07-07 NOTE — ED Notes (Signed)
ED Provider at bedside. 

## 2019-07-08 LAB — GC/CHLAMYDIA PROBE AMP (~~LOC~~) NOT AT ARMC
Chlamydia: NEGATIVE
Neisseria Gonorrhea: NEGATIVE

## 2019-07-22 ENCOUNTER — Ambulatory Visit (INDEPENDENT_AMBULATORY_CARE_PROVIDER_SITE_OTHER): Payer: PRIVATE HEALTH INSURANCE | Admitting: Advanced Practice Midwife

## 2019-07-22 ENCOUNTER — Other Ambulatory Visit (HOSPITAL_COMMUNITY)
Admission: RE | Admit: 2019-07-22 | Discharge: 2019-07-22 | Disposition: A | Discharge status: Home / Self Care | Payer: PRIVATE HEALTH INSURANCE | Source: Ambulatory Visit | Attending: Advanced Practice Midwife | Admitting: Advanced Practice Midwife

## 2019-07-22 ENCOUNTER — Encounter: Payer: Self-pay | Admitting: Advanced Practice Midwife

## 2019-07-22 ENCOUNTER — Other Ambulatory Visit: Payer: Self-pay

## 2019-07-22 VITALS — BP 135/64 | HR 67 | Ht 61.0 in | Wt 170.0 lb

## 2019-07-22 DIAGNOSIS — Z113 Encounter for screening for infections with a predominantly sexual mode of transmission: Secondary | ICD-10-CM

## 2019-07-22 DIAGNOSIS — N898 Other specified noninflammatory disorders of vagina: Secondary | ICD-10-CM

## 2019-07-22 DIAGNOSIS — Z1151 Encounter for screening for human papillomavirus (HPV): Secondary | ICD-10-CM

## 2019-07-22 DIAGNOSIS — Z01419 Encounter for gynecological examination (general) (routine) without abnormal findings: Secondary | ICD-10-CM | POA: Insufficient documentation

## 2019-07-22 NOTE — Progress Notes (Signed)
GYNECOLOGY ANNUAL PREVENTATIVE CARE ENCOUNTER NOTE  Subjective:   Miranda Drake is a 29 y.o. [phone] female here for a routine annual gynecologic exam.  Current complaints: concern about possible HSV. She states that she is having redness and irritaion.   Denies abnormal vaginal bleeding, discharge, pelvic pain, problems with intercourse or other gynecologic concerns.   Patient reports that she has flesh colored bumps on her external genitalia, and some irritation. She states that these bumps have been here for "months". She thought it was BV or yeast and that it would go away, but it has not gone away. She states that at work one day she used toilet paper, and then had a cooling sensation. She was worried that someone there may have left behind some cream that could have caused irritation.    Gynecologic History No LMP recorded. Patient has had an injection. Contraception: Depo-Provera injections She is happy with this birth control method. She has next injection due 08/28/2019  Last Pap: 2018. Results were: normal Last mammogram: NA, age. Results were: NA, age  Obstetric History OB History  Gravida Para Term Preterm AB Living  3 2 2   1 2   SAB TAB Ectopic Multiple Live Births  1     0 2    # Outcome Date GA Lbr Len/2nd Weight Sex Delivery Anes PTL Lv  3 Term 11/12/16 [redacted]w[redacted]d 06:40 / 00:13 7 lb 3.9 oz (3.286 kg) F Vag-Spont EPI  LIV  2 Term 01/31/05   7 lb 11 oz (3.487 kg) M Vag-Spont   LIV  1 SAB             Past Medical History:  Diagnosis Date  . Medical history non-contributory     Past Surgical History:  Procedure Laterality Date  . NO PAST SURGERIES      No current outpatient medications on file prior to visit.   No current facility-administered medications on file prior to visit.    No Known Allergies  Social History   Socioeconomic History  . Marital status: Single    Spouse name: Not on file  . Number of children: Not on file  . Years of education: Not on  file  . Highest education level: Not on file  Occupational History  . Not on file  Tobacco Use  . Smoking status: Former Smoker    Packs/day: 0.30    Types: Cigarettes  . Smokeless tobacco: Never Used  Substance and Sexual Activity  . Alcohol use: Yes    Comment: occ- pt states none since early pregnancy  . Drug use: Not Currently    Types: Marijuana    Comment: none since 6wks  . Sexual activity: Yes    Birth control/protection: Injection    Comment: pregnant  Other Topics Concern  . Not on file  Social History Narrative  . Not on file   Social Determinants of Health   Financial Resource Strain:   . Difficulty of Paying Living Expenses:   Food Insecurity:   . Worried About 02/02/05 in the Last Year:   . Programme researcher, broadcasting/film/video in the Last Year:   Transportation Needs:   . Barista (Medical):   Freight forwarder Lack of Transportation (Non-Medical):   Physical Activity:   . Days of Exercise per Week:   . Minutes of Exercise per Session:   Stress:   . Feeling of Stress :   Social Connections:   . Frequency of Communication with Friends and  Family:   . Frequency of Social Gatherings with Friends and Family:   . Attends Religious Services:   . Active Member of Clubs or Organizations:   . Attends Archivist Meetings:   Marland Kitchen Marital Status:   Intimate Partner Violence:   . Fear of Current or Ex-Partner:   . Emotionally Abused:   Marland Kitchen Physically Abused:   . Sexually Abused:     Family History  Problem Relation Age of Onset  . Hyperlipidemia Mother   . Hypertension Mother   . Hyperlipidemia Sister     The following portions of the patient's history were reviewed and updated as appropriate: allergies, current medications, past family history, past medical history, past social history, past surgical history and problem list.  Review of Systems Pertinent items noted in HPI and remainder of comprehensive ROS otherwise negative.   Objective:  BP 135/64    Pulse 67   Ht 5\' 1"  (1.549 m)   Wt 170 lb (77.1 kg)   BMI 32.12 kg/m  CONSTITUTIONAL: Well-developed, well-nourished female in no acute distress.  HENT:  Normocephalic, atraumatic, External right and left ear normal. Oropharynx is clear and moist EYES: Conjunctivae and EOM are normal. Pupils are equal, round, and reactive to light. No scleral icterus.  NECK: Normal range of motion, supple, no masses.  Normal thyroid.  SKIN: Skin is warm and dry. No rash noted. Not diaphoretic. No erythema. No pallor. NEUROLOGIC: Alert and oriented to person, place, and time. Normal reflexes, muscle tone coordination. No cranial nerve deficit noted. PSYCHIATRIC: Normal mood and affect. Normal behavior. Normal judgment and thought content. CARDIOVASCULAR: Normal heart rate noted, regular rhythm RESPIRATORY: Clear to auscultation bilaterally. Effort and breath sounds normal, no problems with respiration noted. BREASTS: Symmetric in size. No masses, skin changes, nipple drainage, or lymphadenopathy. ABDOMEN: Soft, normal bowel sounds, no distention noted.  No tenderness, rebound or guarding.  PELVIC: Normal appearing external genitalia; normal appearing vaginal mucosa and cervix.  No abnormal discharge noted.  Pap smear obtained.  Normal uterine size, no other palpable masses, no uterine or adnexal tenderness. No sign of any herpes lesions. ?maybe some vulvo-vaginal atrophy, but otherwise normal appearing external genitalia.  MUSCULOSKELETAL: Normal range of motion. No tenderness.  No cyanosis, clubbing, or edema.  2+ distal pulses.   Assessment and Plan:  1. Well woman exam with routine gynecological exam - Cytology - PAP( [place])  2. Screening examination for STD (sexually transmitted disease) - HIV Antibody (routine testing w rflx) - RPR - Hepatitis B Surface AntiGEN - Hepatitis C Antibody  Will follow up results of pap smear and manage accordingly. Routine preventative health maintenance measures  emphasized. Please refer to After Visit Summary for other counseling recommendations.   Marcille Buffy DNP, CNM  07/22/19  2:31 PM

## 2019-07-23 LAB — HEPATITIS C ANTIBODY: Hep C Virus Ab: <0.1 s/co ratio (ref 0.0–0.9)

## 2019-07-23 LAB — RPR: RPR Ser Ql: NONREACTIVE

## 2019-07-23 LAB — HEPATITIS B SURFACE ANTIGEN: Hepatitis B Surface Ag: NEGATIVE

## 2019-07-23 LAB — HIV ANTIBODY (ROUTINE TESTING W REFLEX): HIV Screen 4th Generation wRfx: NONREACTIVE

## 2019-07-24 LAB — CYTOLOGY - PAP
Adequacy: ABSENT
Comment: NEGATIVE
Diagnosis: NEGATIVE
High risk HPV: NEGATIVE

## 2019-08-18 ENCOUNTER — Telehealth: Payer: Self-pay | Admitting: Family Medicine

## 2019-08-18 NOTE — Telephone Encounter (Signed)
Patient would like to get a call back from the clinical staff to talk to someone about her issues.

## 2019-08-22 ENCOUNTER — Encounter (HOSPITAL_BASED_OUTPATIENT_CLINIC_OR_DEPARTMENT_OTHER): Payer: Self-pay | Admitting: Emergency Medicine

## 2019-08-22 ENCOUNTER — Other Ambulatory Visit: Payer: Self-pay

## 2019-08-22 ENCOUNTER — Emergency Department (HOSPITAL_BASED_OUTPATIENT_CLINIC_OR_DEPARTMENT_OTHER)
Admission: EM | Admit: 2019-08-22 | Discharge: 2019-08-22 | Disposition: A | Discharge status: Home / Self Care | Payer: PRIVATE HEALTH INSURANCE | Attending: Emergency Medicine | Admitting: Emergency Medicine

## 2019-08-22 DIAGNOSIS — N898 Other specified noninflammatory disorders of vagina: Secondary | ICD-10-CM | POA: Insufficient documentation

## 2019-08-22 DIAGNOSIS — N899 Noninflammatory disorder of vagina, unspecified: Secondary | ICD-10-CM | POA: Diagnosis present

## 2019-08-22 DIAGNOSIS — Z87891 Personal history of nicotine dependence: Secondary | ICD-10-CM | POA: Insufficient documentation

## 2019-08-22 DIAGNOSIS — N9089 Other specified noninflammatory disorders of vulva and perineum: Secondary | ICD-10-CM

## 2019-08-22 LAB — URINALYSIS, ROUTINE W REFLEX MICROSCOPIC
Bilirubin Urine: NEGATIVE
Glucose, UA: NEGATIVE mg/dL
Hgb urine dipstick: NEGATIVE
Ketones, ur: NEGATIVE mg/dL
Leukocytes,Ua: NEGATIVE
Nitrite: NEGATIVE
Protein, ur: NEGATIVE mg/dL
Specific Gravity, Urine: 1.025 (ref 1.005–1.030)
pH: 6 (ref 5.0–8.0)

## 2019-08-22 LAB — PREGNANCY, URINE: Preg Test, Ur: NEGATIVE

## 2019-08-22 NOTE — ED Provider Notes (Signed)
[facility] Provider Note   CSN: [id number] Arrival date & time: 08/22/19  0018     History Chief Complaint  Patient presents with  . Vaginal Pain    Miranda Drake is a 29 y.o. female.   Vaginal Pain This is a chronic problem. The current episode started more than 1 week ago. The problem occurs constantly. The problem has not changed since onset.Pertinent negatives include no chest pain, no abdominal pain, no headaches and no shortness of breath. Nothing aggravates the symptoms. Nothing relieves the symptoms. She has tried nothing for the symptoms. The treatment provided no relief.  Patient reports she believes her clitoral hood is enlarged asymmetry for at least the last 2 months. She states she has seen gynecology and they do not believe it to be swollen.  She is here tonight to know why this is asymmetric.       Past Medical History:  Diagnosis Date  . Medical history non-contributory     Patient Active Problem List   Diagnosis Date Noted  . Normal spontaneous vaginal delivery 11/13/2016  . Marijuana abuse 05/11/2016  . 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 NO PAST SURGERIES       OB History    Gravida  3   Para  2   Term  2   Preterm      AB  1   Living  2     SAB  1   TAB      Ectopic      Multiple  0   Live Births  2           Family History  Problem Relation Age of Onset  . Hyperlipidemia Mother   . Hypertension Mother   . Hyperlipidemia Sister     Social History   Tobacco Use  . Smoking status: Former Smoker    Packs/day: 0.30    Types: Cigarettes  . Smokeless tobacco: Never Used  Substance Use Topics  . Alcohol use: Yes    Comment: occ- pt states none since early pregnancy  . Drug use: Not Currently    Types: Marijuana    Comment: none since 6wks    Home Medications Prior to Admission medications   Not on File    Allergies    Patient  has no known allergies.  Review of Systems   Review of Systems  Constitutional: Negative for fever.  HENT: Negative for congestion.   Eyes: Negative for visual disturbance.  Respiratory: Negative for shortness of breath.   Cardiovascular: Negative for chest pain.  Gastrointestinal: Negative for abdominal pain.  Genitourinary: Positive for vaginal pain. Negative for genital sores, menstrual problem, vaginal bleeding and vaginal discharge.  Musculoskeletal: Negative for arthralgias.  Skin: Negative for rash.  Neurological: Negative for headaches.  Psychiatric/Behavioral: Negative for agitation.  All other systems reviewed and are negative.   Physical Exam Updated Vital Signs BP 124/76 (BP Location: Right Arm)   Pulse 72   Temp 98.4 F (36.9 C) (Oral)   Resp 16   Ht 5\' 2"  (1.575 m)   Wt 77.1 kg   LMP  (LMP Unknown)   SpO2 100%   BMI 31.09 kg/m   Physical Exam Vitals and nursing note reviewed.  Constitutional:      General: She is not in acute distress.    Appearance: Normal appearance.  HENT:     Head: Normocephalic and atraumatic.  Nose: Nose normal.  Eyes:     Conjunctiva/sclera: Conjunctivae normal.     Pupils: Pupils are equal, round, and reactive to light.  Cardiovascular:     Rate and Rhythm: Normal rate and regular rhythm.     Pulses: Normal pulses.     Heart sounds: Normal heart sounds.  Pulmonary:     Effort: Pulmonary effort is normal.     Breath sounds: Normal breath sounds.  Abdominal:     General: Abdomen is flat. Bowel sounds are normal.     Tenderness: There is no abdominal tenderness. There is no guarding.  Genitourinary:    General: Normal vulva.     Comments: Chaperone Kellie present.  EDP visualized the genitalia.  There is no redness, no swelling.  The tissue is normal in texture and appearance.  No fluctuance.   Musculoskeletal:        General: Normal range of motion.     Cervical back: Normal range of motion and neck supple.  Skin:     General: Skin is warm and dry.     Capillary Refill: Capillary refill takes less than 2 seconds.  Neurological:     General: No focal deficit present.     Mental Status: She is alert and oriented to person, place, and time.     Deep Tendon Reflexes: Reflexes normal.  Psychiatric:        Mood and Affect: Mood normal.        Behavior: Behavior normal.     ED Results / Procedures / Treatments   Labs (all labs ordered are listed, but only abnormal results are displayed) Results for orders placed or performed during the hospital encounter of 08/22/19  Pregnancy, urine  Result Value Ref Range   Preg Test, Ur NEGATIVE NEGATIVE  Urinalysis, Routine w reflex microscopic  Result Value Ref Range   Color, Urine YELLOW YELLOW   APPearance CLEAR CLEAR   Specific Gravity, Urine 1.025 1.005 - 1.030   pH 6.0 5.0 - 8.0   Glucose, UA NEGATIVE NEGATIVE mg/dL   Hgb urine dipstick NEGATIVE NEGATIVE   Bilirubin Urine NEGATIVE NEGATIVE   Ketones, ur NEGATIVE NEGATIVE mg/dL   Protein, ur NEGATIVE NEGATIVE mg/dL   Nitrite NEGATIVE NEGATIVE   Leukocytes,Ua NEGATIVE NEGATIVE   No results found.  Procedures Procedures (including critical care time)  Medications Ordered in ED Medications - No data to display  ED Course  I have reviewed the triage vital signs and the nursing notes.  Pertinent labs & imaging results that were available during my care of the patient were reviewed by me and considered in my medical decision making (see chart for details).    EDP reported to the patient that the tissue appears normal.  I see no swelling nor lesions.  Tissue appears symmetric.  I see no signs of an emergency condition at this time. The patient is bothered that she does not know why this is happening as it " used to be an in-ie".  EDP explained that sometimes the ER cannot find a diagnosis for an ongoing issue but we realizes that this is important to the patient and EDP suggested seeing GYN again as they  are the specialists in this area.    Miranda Drake was evaluated in Emergency Department on 08/22/2019 for the symptoms described in the history of present illness. She was evaluated in the context of the global COVID-19 pandemic, which necessitated consideration that the patient might be at risk for infection with  the SARS-CoV-2 virus that causes COVID-19. Institutional protocols and algorithms that pertain to the evaluation of patients at risk for COVID-19 are in a state of rapid change based on information released by regulatory bodies including the CDC and federal and state organizations. These policies and algorithms were followed during the patient's care in the ED.  Final Clinical Impression(s) / ED Diagnoses Final diagnoses:  Irritation of clitoris   Return for intractable cough, coughing up blood,fevers >100.4 unrelieved by medication, shortness of breath, intractable vomiting, chest pain, shortness of breath, weakness,numbness, changes in speech, facial asymmetry,abdominal pain, passing out,Inability to tolerate liquids or food, cough, altered mental status or any concerns. No signs of systemic illness or infection. The patient is nontoxic-appearing on exam and vital signs are within normal limits.   I have reviewed the triage vital signs and the nursing notes. Pertinent labs &imaging results that were available during my care of the patient were reviewed by me and considered in my medical decision making (see chart for details).After history, exam, and medical workup I feel the patient has beenappropriately medically screened and is safe for discharge home. Pertinent diagnoses were discussed with the patient. Patient was given return precautions.    Naziya Hegwood, MD 08/22/19 [id number]

## 2019-08-22 NOTE — ED Triage Notes (Signed)
Vaginal area swollen, pt states near clitoris on the right side. Sx onset 2 months ago.

## 2019-08-25 NOTE — Telephone Encounter (Addendum)
I called Miranda Drake and she reports she feels like her clitoris is swollen and asymetric like before. Per chart seen in our office in April and in ED earlier this month with similar complaints. Both visits exam normal.  She had appointment this Friday with Dr. Burnice LoganKatrinka Blazing in [facility] office and has depo Thursday in our office. I explained Dr. Burnice Logan and [facility] can address these concerns and the depo shot and we will cancel Thrusday appt. She agreed and voices understanding.  Stephanee Barcomb,RN

## 2019-08-28 ENCOUNTER — Ambulatory Visit: Payer: PRIVATE HEALTH INSURANCE

## 2019-08-29 ENCOUNTER — Other Ambulatory Visit: Payer: Self-pay

## 2019-08-29 ENCOUNTER — Encounter: Payer: Self-pay | Admitting: Obstetrics & Gynecology

## 2019-08-29 ENCOUNTER — Ambulatory Visit (INDEPENDENT_AMBULATORY_CARE_PROVIDER_SITE_OTHER): Payer: PRIVATE HEALTH INSURANCE | Admitting: Obstetrics & Gynecology

## 2019-08-29 VITALS — BP 122/67 | HR 75 | Ht 62.0 in | Wt 175.0 lb

## 2019-08-29 DIAGNOSIS — N762 Acute vulvitis: Secondary | ICD-10-CM

## 2019-08-29 DIAGNOSIS — Z3042 Encounter for surveillance of injectable contraceptive: Secondary | ICD-10-CM | POA: Diagnosis not present

## 2019-08-29 MED ORDER — MEDROXYPROGESTERONE ACETATE 150 MG/ML IM SUSP
150.0000 mg | Freq: Once | INTRAMUSCULAR | Status: AC
  Administered 2019-08-29: 150 mg via INTRAMUSCULAR

## 2019-08-29 NOTE — Progress Notes (Signed)
History:  29 y.o. [id number] here today for eval of swelling around that clitoris that was noted since Mar. Pt initially noticed this after using toilet paper and she is not sure if there is a relation to that. Pt reports that she has been putting Aquaphor on the area.  She recently took a pill called 'pussy pill' to increase her libido and she reports that her sx got worse after that. Her sx are also worse with wiping and intercourse.      The following portions of the patient's history were reviewed and updated as appropriate: allergies, current medications, past family history, past medical history, past social history, past surgical history and problem list.  Review of Systems:  Pertinent items are noted in HPI.    Objective:  Physical Exam Blood pressure 122/67, pulse 75, height 5\' 2"  (1.575 m), weight 175 lb (79.4 kg).  CONSTITUTIONAL: Well-developed, well-nourished female in no acute distress.  HENT:  Normocephalic, atraumatic EYES: Conjunctivae and EOM are normal. No scleral icterus.  NECK: Normal range of motion SKIN: Skin is warm and dry. No rash noted. Not diaphoretic.No pallor. NEUROLGIC: Alert and oriented to person, place, and time. Normal coordination.  Pelvic: on the left side of the clitoral hood there are 2 area that are inflamed. There is no lesion there. The area surround this is WNL. This rea is also tender. HSV cx obtained   Assessment & Plan:   Swelling of clitoral hood  HSV cx obtained.   Pt asked to stop applying anything to the area  F/u in 5 days to see if area has improved and to review the HSV cx  Contraception counseling  Reviewed all forms of birth control options available including abstinence; over the counter/barrier methods; hormonal contraceptive  medication including pill, patch, ring, injection,contraceptive implant; hormonal and nonhormonal IUDs; permanent sterilization  options including vasectomy and the various tubal sterilization modalities. Risks  and benefits reviewed.  Questions were answered.  I   Pt will cont Depo Provera 150mg  IM given today    Total face-to-face time with patient was .  Greater than 50% was spent in counseling and coordination of care with the patient.   Carolyn L. Harraway-Smith, M.D., 

## 2019-08-29 NOTE — Progress Notes (Signed)
Patient has been on Depo Provera and is due for injection but is thinking about switching birth control method. Patient states she has had clitoral swelling for approx two months.  Last pap 07-22-19 WNL Armandina Stammer RN

## 2019-09-01 LAB — HERPES SIMPLEX VIRUS CULTURE

## 2019-09-01 MED ORDER — CLOBETASOL PROPIONATE 0.05 % EX CREA: 1.0000 "application " | TOPICAL_CREAM | Freq: Two times a day (BID) | CUTANEOUS | 0 refills | Status: DC

## 2019-09-01 NOTE — Addendum Note (Signed)
Addended by: Willodean Rosenthal on: 09/01/2019 11:20 AM   Modules accepted: Orders

## 2019-09-03 ENCOUNTER — Ambulatory Visit: Payer: PRIVATE HEALTH INSURANCE | Admitting: Obstetrics & Gynecology

## 2019-09-11 ENCOUNTER — Ambulatory Visit (INDEPENDENT_AMBULATORY_CARE_PROVIDER_SITE_OTHER): Payer: PRIVATE HEALTH INSURANCE | Admitting: Obstetrics & Gynecology

## 2019-09-11 ENCOUNTER — Encounter: Payer: Self-pay | Admitting: Obstetrics & Gynecology

## 2019-09-11 ENCOUNTER — Other Ambulatory Visit: Payer: Self-pay

## 2019-09-11 ENCOUNTER — Other Ambulatory Visit (HOSPITAL_COMMUNITY)
Admission: RE | Admit: 2019-09-11 | Discharge: 2019-09-11 | Disposition: A | Discharge status: Home / Self Care | Payer: PRIVATE HEALTH INSURANCE | Source: Ambulatory Visit | Attending: Obstetrics & Gynecology | Admitting: Obstetrics & Gynecology

## 2019-09-11 VITALS — BP 130/78 | HR 73 | Ht 61.5 in | Wt 173.0 lb

## 2019-09-11 DIAGNOSIS — N898 Other specified noninflammatory disorders of vagina: Secondary | ICD-10-CM

## 2019-09-11 DIAGNOSIS — N9089 Other specified noninflammatory disorders of vulva and perineum: Secondary | ICD-10-CM | POA: Insufficient documentation

## 2019-09-11 MED ORDER — CLOBETASOL PROPIONATE 0.05 % EX CREA: 1.0000 "application " | TOPICAL_CREAM | Freq: Two times a day (BID) | CUTANEOUS | 2 refills | Status: DC

## 2019-09-11 NOTE — Addendum Note (Signed)
Addended by: Anell Barr on: 09/11/2019 10:24 AM   Modules accepted: Orders

## 2019-09-11 NOTE — Progress Notes (Signed)
History:  29 y.o. Miranda Drake here today for reval of clitoral swelling. Pt reports that she cont to have itching of that area. She has stopped the OTC agents she was suing. She reports that she now has a vaginal odor that smells fishy. She denies abnormal discharge.    The following portions of the patient's history were reviewed and updated as appropriate: allergies, current medications, past family history, past medical history, past social history, past surgical history and problem list.  Review of Systems:  Pertinent items are noted in HPI.    Objective:  Physical Exam Blood pressure 130/78, pulse 73, height 5' 1.5" (1.562 m), weight 173 lb (78.5 kg).  CONSTITUTIONAL: Well-developed, well-nourished female in no acute distress.  HENT:  Normocephalic, atraumatic EYES: Conjunctivae and EOM are normal. No scleral icterus.  NECK: Normal range of motion SKIN: Skin is warm and dry. No rash noted. Not diaphoretic.No pallor. NEUROLGIC: Alert and oriented to person, place, and time. Normal coordination.  Pelvic: The clitoris is still minimally enlarged on the left side. It is imporved from her last visit. There is no excoriation. Speculum exam not performed.   Labs and Imaging HSV cx neg  Assessment & Plan:  Clitoral irritation/inflammation  Clobetasol cream bid x 4 week  F/u in 4 weeks or sooner prn  Vaginal odor  F/u Affirm test  Total face-to-face time with patient was 20 min.  Greater than 50% was spent in counseling and coordination of care with the patient.   Josslin Sanjuan L. Harraway-Smith, M.D., Evern Core

## 2019-09-12 LAB — CERVICOVAGINAL ANCILLARY ONLY
Bacterial Vaginitis (gardnerella): NEGATIVE
Candida Glabrata: NEGATIVE
Candida Vaginitis: NEGATIVE
Comment: NEGATIVE
Comment: NEGATIVE
Comment: NEGATIVE

## 2019-09-19 DIAGNOSIS — S93401A Sprain of unspecified ligament of right ankle, initial encounter: Secondary | ICD-10-CM | POA: Diagnosis not present

## 2019-09-19 DIAGNOSIS — M25571 Pain in right ankle and joints of right foot: Secondary | ICD-10-CM | POA: Diagnosis not present

## 2019-10-13 ENCOUNTER — Ambulatory Visit: Payer: PRIVATE HEALTH INSURANCE | Admitting: Obstetrics & Gynecology

## 2019-11-19 ENCOUNTER — Ambulatory Visit: Payer: PRIVATE HEALTH INSURANCE

## 2020-01-07 DIAGNOSIS — S93401A Sprain of unspecified ligament of right ankle, initial encounter: Secondary | ICD-10-CM | POA: Diagnosis not present

## 2020-01-07 DIAGNOSIS — M25571 Pain in right ankle and joints of right foot: Secondary | ICD-10-CM | POA: Diagnosis not present

## 2020-01-16 DIAGNOSIS — R531 Weakness: Secondary | ICD-10-CM | POA: Diagnosis not present

## 2020-04-09 DIAGNOSIS — N898 Other specified noninflammatory disorders of vagina: Secondary | ICD-10-CM | POA: Diagnosis not present

## 2020-04-10 ENCOUNTER — Other Ambulatory Visit: Payer: Self-pay

## 2020-04-10 ENCOUNTER — Encounter (HOSPITAL_BASED_OUTPATIENT_CLINIC_OR_DEPARTMENT_OTHER): Payer: Self-pay | Admitting: Emergency Medicine

## 2020-04-10 ENCOUNTER — Emergency Department (HOSPITAL_BASED_OUTPATIENT_CLINIC_OR_DEPARTMENT_OTHER)
Admission: EM | Admit: 2020-04-10 | Discharge: 2020-04-10 | Disposition: A | Discharge status: Home / Self Care | Payer: PRIVATE HEALTH INSURANCE | Attending: Emergency Medicine | Admitting: Emergency Medicine

## 2020-04-10 DIAGNOSIS — N898 Other specified noninflammatory disorders of vagina: Secondary | ICD-10-CM | POA: Insufficient documentation

## 2020-04-10 DIAGNOSIS — N760 Acute vaginitis: Secondary | ICD-10-CM

## 2020-04-10 DIAGNOSIS — Z87891 Personal history of nicotine dependence: Secondary | ICD-10-CM | POA: Insufficient documentation

## 2020-04-10 DIAGNOSIS — R1030 Lower abdominal pain, unspecified: Secondary | ICD-10-CM | POA: Diagnosis present

## 2020-04-10 LAB — URINALYSIS, ROUTINE W REFLEX MICROSCOPIC
Bilirubin Urine: NEGATIVE
Glucose, UA: NEGATIVE mg/dL
Hgb urine dipstick: NEGATIVE
Ketones, ur: NEGATIVE mg/dL
Leukocytes,Ua: NEGATIVE
Nitrite: NEGATIVE
Protein, ur: NEGATIVE mg/dL
Specific Gravity, Urine: 1.03 (ref 1.005–1.030)
pH: 6 (ref 5.0–8.0)

## 2020-04-10 LAB — WET PREP, GENITAL
Sperm: NONE SEEN
Trich, Wet Prep: NONE SEEN
Yeast Wet Prep HPF POC: NONE SEEN

## 2020-04-10 LAB — PREGNANCY, URINE: Preg Test, Ur: NEGATIVE

## 2020-04-10 MED ORDER — LIDOCAINE HCL (PF) 1 % IJ SOLN
1.0000 mL | Freq: Once | INTRAMUSCULAR | Status: AC
  Administered 2020-04-10: 16:00:00 1 mL
  Filled 2020-04-10: qty 5

## 2020-04-10 MED ORDER — CEFTRIAXONE SODIUM 500 MG IJ SOLR
500.0000 mg | Freq: Once | INTRAMUSCULAR | Status: AC
  Administered 2020-04-10: 16:00:00 500 mg via INTRAMUSCULAR
  Filled 2020-04-10: qty 500

## 2020-04-10 MED ORDER — AZITHROMYCIN 250 MG PO TABS
1000.0000 mg | ORAL_TABLET | Freq: Once | ORAL | Status: AC
  Administered 2020-04-10: 16:00:00 1000 mg via ORAL
  Filled 2020-04-10: qty 4

## 2020-04-10 MED ORDER — METRONIDAZOLE 500 MG PO TABS
500.0000 mg | ORAL_TABLET | Freq: Once | ORAL | Status: AC
  Administered 2020-04-10: 16:00:00 500 mg via ORAL
  Filled 2020-04-10: qty 1

## 2020-04-10 MED ORDER — METRONIDAZOLE 500 MG PO TABS: 500.0000 mg | ORAL_TABLET | Freq: Two times a day (BID) | ORAL | 0 refills | Status: DC

## 2020-04-10 NOTE — ED Provider Notes (Signed)
  Physical Exam  BP 108/62 (BP Location: Right Arm)   Pulse 60   Temp 98.1 F (36.7 C) (Oral)   Resp 16   Ht 5\' 2"  (1.575 m)   Wt 77.1 kg   LMP 04/03/2020   SpO2 100%   BMI 31.09 kg/m   Physical Exam  ED Course/Procedures     Procedures  MDM  Care assumed at 3 pm. Patient has vaginal discharge. Wet prep pending at sign out and showed clue cells. Given empiric treatment for GC/chlamydia already. Will dc home with flagyl      04/05/2020, MD 04/10/20 [phone]

## 2020-04-10 NOTE — ED Triage Notes (Signed)
Pt arrives pov with c/o lower abdominal pain and vaginal discharge with odor x 1 week. Pt endorses concern for STI.

## 2020-04-10 NOTE — ED Provider Notes (Addendum)
[facility] EMERGENCY DEPARTMENT Provider Note   CSN: [id number] Arrival date & time: 04/10/20  1314     History Chief Complaint  Patient presents with  . Abdominal Pain    Miranda Drake is a 29 y.o. female.  HPI   Patient presents ED for evaluation of vaginal discharge and odor.  Patient states she has had some mild lower abdominal discomfort for the past week or so.  She is also noticed a very bad vaginal odor.  She is also noticing mild burning with urination.  She is not having any fevers or chills.  She has not noticed any lesions.  No vomiting or diarrhea.  Patient is concerned that she might have an STD.  Past Medical History:  Diagnosis Date  . Medical history non-contributory     Patient Active Problem List   Diagnosis Date Noted  . Normal spontaneous vaginal delivery 11/13/2016  . Marijuana abuse 05/11/2016  . 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 NO PAST SURGERIES       OB History    Gravida  3   Para  2   Term  2   Preterm      AB  1   Living  2     SAB  1   IAB      Ectopic      Multiple  0   Live Births  2           Family History  Problem Relation Age of Onset  . Hyperlipidemia Mother   . Hypertension Mother   . Hyperlipidemia Sister     Social History   Tobacco Use  . Smoking status: Former Smoker    Packs/day: 0.30    Types: Cigarettes  . Smokeless tobacco: Never Used  Vaping Use  . Vaping Use: Never used  Substance Use Topics  . Alcohol use: Yes    Comment: occ- pt states none since early pregnancy  . Drug use: Not Currently    Types: Marijuana    Comment: none since 6wks    Home Medications Prior to Admission medications   Medication Sig Start Date End Date Taking? Authorizing Provider  clobetasol cream (TEMOVATE) 0.05 % Apply 1 application topically 2 (two) times daily. Apply to affected area 09/01/19   Willodean Rosenthal, MD  clobetasol  cream (TEMOVATE) 0.05 % Apply 1 application topically 2 (two) times daily. Apply to affected area 09/11/19   Willodean Rosenthal, MD    Allergies    Patient has no known allergies.  Review of Systems   Review of Systems  All other systems reviewed and are negative.   Physical Exam Updated Vital Signs BP 108/62 (BP Location: Right Arm)   Pulse 60   Temp 98.1 F (36.7 C) (Oral)   Resp 16   Ht 1.575 m (5\' 2" )   Wt 77.1 kg   LMP 04/03/2020   SpO2 100%   BMI 31.09 kg/m   Physical Exam Vitals and nursing note reviewed.  Constitutional:      General: She is not in acute distress.    Appearance: She is well-developed and well-nourished.  HENT:     Head: Normocephalic and atraumatic.     Right Ear: External ear normal.     Left Ear: External ear normal.  Eyes:     General: No scleral icterus.       Right eye: No discharge.  Left eye: No discharge.     Conjunctiva/sclera: Conjunctivae normal.  Neck:     Trachea: No tracheal deviation.  Cardiovascular:     Rate and Rhythm: Normal rate and regular rhythm.     Pulses: Intact distal pulses.  Pulmonary:     Effort: Pulmonary effort is normal. No respiratory distress.     Breath sounds: Normal breath sounds. No stridor. No wheezing or rales.  Abdominal:     General: Bowel sounds are normal. There is no distension.     Palpations: Abdomen is soft.     Tenderness: There is no abdominal tenderness. There is no guarding or rebound.  Genitourinary:    Vagina: Vaginal discharge present. No erythema, tenderness or bleeding.     Comments: Tan vaginal discharge, no cervical motion tenderness, no uterine or adnexal tenderness, no mass appreciated Musculoskeletal:        General: No tenderness or edema.     Cervical back: Neck supple.  Skin:    General: Skin is warm and dry.     Findings: No rash.  Neurological:     Mental Status: She is alert.     Cranial Nerves: No cranial nerve deficit (no facial droop, extraocular  movements intact, no slurred speech).     Sensory: No sensory deficit.     Motor: No abnormal muscle tone or seizure activity.     Coordination: Coordination normal.     Deep Tendon Reflexes: Strength normal.  Psychiatric:        Mood and Affect: Mood and affect normal.     ED Results / Procedures / Treatments   Labs (all labs ordered are listed, but only abnormal results are displayed) Labs Reviewed  WET PREP, GENITAL  PREGNANCY, URINE  URINALYSIS, ROUTINE W REFLEX MICROSCOPIC  RPR  HIV ANTIBODY (ROUTINE TESTING W REFLEX)  GC/CHLAMYDIA PROBE AMP () NOT AT [facility]     Procedures Procedures (including critical care time)  Medications Ordered in ED Medications  azithromycin (ZITHROMAX) tablet 1,000 mg (has no administration in time range)  cefTRIAXone (ROCEPHIN) injection 500 mg (has no administration in time range)  lidocaine (PF) (XYLOCAINE) 1 % injection 1 mL (has no administration in time range)    ED Course  I have reviewed the triage vital signs and the nursing notes.  Pertinent labs & imaging results that were available during my care of the patient were reviewed by me and considered in my medical decision making (see chart for details).    MDM Rules/Calculators/A&P                         Patient presented to the ED for evaluation of vaginal discharge and cramping.  Patient does have brown discharge on exam.  No significant tenderness.  Will treat for gonorrhea chlamydia.  Wet prep HIV and RPR also sent soft.  Wet prep is pending.  Dr. Silverio Lay will follow up on the results  Final Clinical Impression(s) / ED Diagnoses Final diagnoses:  Vaginal discharge    Rx / DC Orders ED Discharge Orders    None       Linwood Dibbles, MD 04/10/20 1515    Linwood Dibbles, MD 04/10/20 [phone]

## 2020-04-10 NOTE — Discharge Instructions (Addendum)
You were treated for gonorrhea and chlamydia today.    You have bacterial vaginosis. Take flagyl as prescribed   We did send off tests that will be available over the next couple of days.  HIV and syphilis test were also sent off.  Avoid intercourse for the next week.  Follow-up with your OB/GYN doctor if the symptoms persist

## 2020-04-11 LAB — HIV ANTIBODY (ROUTINE TESTING W REFLEX): HIV Screen 4th Generation wRfx: NONREACTIVE

## 2020-04-11 LAB — RPR: RPR Ser Ql: NONREACTIVE

## 2020-04-12 LAB — GC/CHLAMYDIA PROBE AMP (~~LOC~~) NOT AT ARMC
Chlamydia: NEGATIVE
Comment: NEGATIVE
Comment: NORMAL
Neisseria Gonorrhea: NEGATIVE

## 2020-07-26 DIAGNOSIS — Z136 Encounter for screening for cardiovascular disorders: Secondary | ICD-10-CM | POA: Diagnosis not present

## 2020-07-26 DIAGNOSIS — Z1329 Encounter for screening for other suspected endocrine disorder: Secondary | ICD-10-CM | POA: Diagnosis not present

## 2020-07-26 DIAGNOSIS — M5417 Radiculopathy, lumbosacral region: Secondary | ICD-10-CM | POA: Diagnosis not present

## 2020-07-26 DIAGNOSIS — Z0001 Encounter for general adult medical examination with abnormal findings: Secondary | ICD-10-CM | POA: Diagnosis not present

## 2020-07-26 DIAGNOSIS — E6609 Other obesity due to excess calories: Secondary | ICD-10-CM | POA: Diagnosis not present

## 2020-07-26 DIAGNOSIS — Z131 Encounter for screening for diabetes mellitus: Secondary | ICD-10-CM | POA: Diagnosis not present

## 2020-07-27 DIAGNOSIS — J358 Other chronic diseases of tonsils and adenoids: Secondary | ICD-10-CM | POA: Diagnosis not present

## 2020-07-27 DIAGNOSIS — R196 Halitosis: Secondary | ICD-10-CM | POA: Diagnosis not present

## 2020-08-09 DIAGNOSIS — M5417 Radiculopathy, lumbosacral region: Secondary | ICD-10-CM | POA: Diagnosis not present

## 2020-08-09 DIAGNOSIS — E6609 Other obesity due to excess calories: Secondary | ICD-10-CM | POA: Diagnosis not present

## 2020-08-31 ENCOUNTER — Other Ambulatory Visit: Payer: Self-pay

## 2020-08-31 ENCOUNTER — Ambulatory Visit: Payer: PRIVATE HEALTH INSURANCE | Attending: Internal Medicine | Admitting: Physical Therapy

## 2020-08-31 ENCOUNTER — Encounter: Payer: Self-pay | Admitting: Physical Therapy

## 2020-08-31 DIAGNOSIS — R293 Abnormal posture: Secondary | ICD-10-CM | POA: Insufficient documentation

## 2020-08-31 DIAGNOSIS — R29898 Other symptoms and signs involving the musculoskeletal system: Secondary | ICD-10-CM | POA: Diagnosis not present

## 2020-08-31 DIAGNOSIS — G8929 Other chronic pain: Secondary | ICD-10-CM | POA: Insufficient documentation

## 2020-08-31 DIAGNOSIS — M5441 Lumbago with sciatica, right side: Secondary | ICD-10-CM | POA: Diagnosis not present

## 2020-08-31 NOTE — Therapy (Signed)
[facility] [address]  [address] [place], [place], [address] Phone: [phone]   Fax:  [phone]  Physical Therapy Evaluation  Patient Details  Name: Miranda DumasKasaundra Drake MRN: [id number] Date of Birth: 04/12/1991 Referring Provider (PT): Jackie PlumGeorge Osei-Bonsu, MD   Encounter Date: 08/31/2020   PT End of Session - 08/31/20 1203    Visit Number 1    Number of Visits 7    Date for PT Re-Evaluation 10/12/20    Authorization Type [employer] [place] [employer]    PT Start Time 1109    PT Stop Time 1150    PT Time Calculation (min) 41 min    Activity Tolerance Patient tolerated treatment well    Behavior [name]           Past Medical History:  Diagnosis Date  . Medical history non-contributory     Past Surgical History:  Procedure Laterality Date  . NO PAST SURGERIES      There were no vitals filed for this visit.    Subjective Assessment - 08/31/20 1112    Subjective Patient reports 1 year of R buttock pain with progressive worsening recently. Reports that she started a new job and sprained her R ankle around that time. Notes that she stands on a concrete floor at work all day. Pain is located over the R buttocks with N/T down the R LE to the foot. Denies B&B changes. Worse with standing and during the middle of the work day. Better with stretching, Denies pain in LB.    Pertinent History none    Limitations Lifting;Standing;Walking;House hold activities    How long can you sit comfortably? unlimited    How long can you stand comfortably? ~2 hours    How long can you walk comfortably? ~2 hours    Diagnostic tests per patient- had x-ray done on lumbar spine which showed "nothing significant"    Patient Stated Goals decrease pain    Currently in Pain? No/denies    Pain Score 0-No pain    Pain Location Buttocks    Pain Orientation Right    Pain Descriptors / Indicators Sharp;Aching     Pain Type Chronic pain              [facility] PT Assessment - 08/31/20 1118      Assessment   Medical Diagnosis Back pain    Referring Provider (PT) Jackie PlumGeorge Osei-Bonsu, MD    Onset Date/Surgical Date 09/01/19    Next MD Visit 4 months    Prior Therapy yes- ankle sprain      Precautions   Precautions None      Balance Screen   Has the patient fallen in the past 6 months No    Has the patient had a decrease in activity level because of a fear of falling?  No    Is the patient reluctant to leave their home because of a fear of falling?  No      Home Environment   Living Environment Private residence    Living Arrangements Children    Available Help at Discharge Family    Type of Home House    Home Access Stairs to enter    Entrance Stairs-Number of Steps 1-2    Entrance Stairs-Rails None    Home Layout Two level    Alternate Level Stairs-Number of Steps 14    Alternate Level Stairs-Rails Right      Prior  Function   Level of Independence Independent    Vocation Full time employment    Vocation Requirements standing on concrete floors, lifting up to 35lbs , bending    Leisure none      Cognition   Overall Cognitive Status Within Functional Limits for tasks assessed      Sensation   Light Touch Appears Intact      Coordination   Gross Motor Movements are Fluid and Coordinated Yes      Posture/Postural Control   Posture/Postural Control Postural limitations    Postural Limitations Rounded Shoulders;Increased lumbar lordosis   B genu recurvatum     ROM / Strength   AROM / PROM / Strength AROM;Strength      AROM   AROM Assessment Site Lumbar    Lumbar Flexion ankles   mild LBP   Lumbar Extension moderately limited   stiff   Lumbar - Right Side Bend proximal tibia    Lumbar - Left Side Bend proximal tibia    Lumbar - Right Rotation WFL    Lumbar - Left Rotation mild-moderately limited      Strength   Strength Assessment Site Hip;Knee;Ankle    Right/Left Hip  Right;Left    Right Hip Flexion 4+/5    Right Hip Extension 4+/5    Right Hip ABduction 4+/5    Right Hip ADduction 4+/5    Left Hip Extension 4+/5    Left Hip ABduction 4/5    Left Hip ADduction 4-/5    Right/Left Knee Right;Left    Right Knee Flexion 4/5    Right Knee Extension 5/5    Left Knee Flexion 4+/5    Left Knee Extension 5/5    Right/Left Ankle Right;Left    Right Ankle Dorsiflexion 4+/5    Right Ankle Plantar Flexion 4/5    Left Ankle Dorsiflexion 4+/5    Left Ankle Plantar Flexion 4/5      Flexibility   Soft Tissue Assessment /Muscle Length yes    Hamstrings B WFL    Quadriceps R hip flexor tight    Piriformis mildly tight B KTOS and fig 4      Palpation   Spinal mobility good spinal mobility with central PAs and pain-free    SI assessment  L ASIS lower, L PSIS lower   suggests L downslip   Palpation comment no TTP over LB, hips; soft tissue restriction in B proximal glutes and piriformis- more TTP on L      Ambulation/Gait   Assistive device None    Gait Pattern Step-through pattern;Lateral hip instability    Ambulation Surface Level;Indoor    Gait velocity WFL                      Objective measurements completed on examination: See above findings.               PT Education - 08/31/20 1202    Education Details prognosis, POC, HEP    Person(s) Educated Patient    Methods Explanation;Demonstration;Tactile cues;Verbal cues;Handout    Comprehension Verbalized understanding;Returned demonstration            PT Short Term Goals - 08/31/20 1209      PT SHORT TERM GOAL #1   Title Patient to be independent with initial HEP.    Time 3    Period Weeks    Status New    Target Date 09/21/20  PT Long Term Goals - 08/31/20 1209      PT LONG TERM GOAL #1   Title Patient to be independent with advanced HEP.    Time 6    Period Weeks    Status New    Target Date 10/12/20      PT LONG TERM GOAL #2   Title  Patient to demonstrate B LE strength >/=4+/5.    Time 6    Period Weeks    Status New    Target Date 10/12/20      PT LONG TERM GOAL #3   Title Patient to demonstrate lumbar AROM WFL and without pain limiting.    Time 6    Period Weeks    Status New    Target Date 10/12/20      PT LONG TERM GOAL #4   Title Patient to report tolerance for 1/2 a work shift without pain.    Time 6    Period Weeks    Status New    Target Date 10/12/20      PT LONG TERM GOAL #5   Title Patient to recall and demonstrate good body mechanics when lifting 20 lb box from floor.    Time 6    Period Weeks    Status New    Target Date 10/12/20                  Plan - 08/31/20 1203    Clinical Impression Statement Patient is a 30 y/o F presenting to OPPT with c/o progressive insidious R buttock pain for the past year. Notes intermittent N/T down the R LE down to the foot. Denies B&B changes or LBP. Pain worse with prolonged standing, especially when at work as she stands on a concrete floor. Patient today presenting with increased lumbar lordosis and genu recurvatum, limited lumbar AROM, L hip, knee flexion, and B plantarflexion weakness, R hip flexor and mild B piriformis tightness, SIJ alignment suggesting L innominate down slip, soft tissue restriction in B proximal glutes and piriformis- more TTP on L, and gait deviations. Patient was educated on gentle stretching and strengthening HEP- patient reported understanding. Would benefit from skilled PT services 1x/week for 6 weeks to address aforementioned impairments.    Personal Factors and Comorbidities Age;Past/Current Experience;Profession;Time since onset of injury/illness/exacerbation    Examination-Activity Limitations Squat;Stairs;Bend;Caring for Others;Stand;Carry;Lift;Locomotion Level    Examination-Participation Restrictions Occupation;Yard Work;Shop;Community Activity;Cleaning;Church    Stability/Clinical Decision Making Evolving/Moderate  complexity    Clinical Decision Making Moderate    Rehab Potential Good    PT Frequency 1x / week    PT Duration 6 weeks    PT Treatment/Interventions ADLs/Self Care Home Management;Cryotherapy;Electrical Stimulation;Iontophoresis 4mg /ml Dexamethasone;Moist Heat;Traction;Balance training;Therapeutic exercise;Therapeutic activities;Functional mobility training;Stair training;Gait training;Ultrasound;Neuromuscular re-education;Patient/family education;Manual techniques;Taping;Energy conservation;Dry needling;Passive range of motion    PT Next Visit Plan reassess HEP; progress lumbopelvic mobility and hip strengthening, LE stretching    Consulted and Agree with Plan of Care Patient           Patient will benefit from skilled therapeutic intervention in order to improve the following deficits and impairments:  Pain,Increased fascial restricitons,Decreased strength,Decreased activity tolerance,Decreased balance,Increased muscle spasms,Improper body mechanics,Decreased range of motion,Impaired flexibility,Postural dysfunction  Visit Diagnosis: Chronic low back pain with right-sided sciatica, unspecified back pain laterality  Other symptoms and signs involving the musculoskeletal system  Abnormal posture     Problem List Patient Active Problem List   Diagnosis Date Noted  . Normal spontaneous vaginal delivery 11/13/2016  .  Marijuana abuse 05/11/2016  . Thyroid enlarged 05/08/2016     Anette Guarneri, PT, DPT 08/31/20 12:17 PM   [facility] [address]  [address] [place], [place], [address] Phone: [phone]   Fax:  [phone]  Name: Miranda Drake MRN: [id number] Date of Birth: November 01, 1990

## 2020-09-06 ENCOUNTER — Ambulatory Visit: Payer: PRIVATE HEALTH INSURANCE

## 2020-09-14 DIAGNOSIS — R196 Halitosis: Secondary | ICD-10-CM | POA: Diagnosis not present

## 2020-09-14 DIAGNOSIS — J358 Other chronic diseases of tonsils and adenoids: Secondary | ICD-10-CM | POA: Diagnosis not present

## 2020-09-15 ENCOUNTER — Other Ambulatory Visit: Payer: Self-pay

## 2020-09-15 ENCOUNTER — Ambulatory Visit: Payer: No Typology Code available for payment source | Attending: Internal Medicine

## 2020-09-15 DIAGNOSIS — R29898 Other symptoms and signs involving the musculoskeletal system: Secondary | ICD-10-CM | POA: Diagnosis not present

## 2020-09-15 DIAGNOSIS — M5441 Lumbago with sciatica, right side: Secondary | ICD-10-CM | POA: Diagnosis not present

## 2020-09-15 DIAGNOSIS — G8929 Other chronic pain: Secondary | ICD-10-CM | POA: Diagnosis not present

## 2020-09-15 DIAGNOSIS — R293 Abnormal posture: Secondary | ICD-10-CM | POA: Insufficient documentation

## 2020-09-15 NOTE — Therapy (Addendum)
[facility] [address]  [place], [place], [address] Phone: [phone]   Fax:  [phone]  Physical Therapy Treatment  Patient Details  Name: Miranda Drake MRN: [id number] Date of Birth: [birth year] Referring Provider (PT): Benito Mccreedy, MD   Encounter Date: 09/15/2020   PT End of Session - 09/15/20 1153     Visit Number 2    Number of Visits 7    Date for PT Re-Evaluation 10/12/20    Authorization Type [employer] [place] [employer]    PT Start Time 1105    PT Stop Time 1145    PT Time Calculation (min) 40 min    Activity Tolerance Patient tolerated treatment well    Behavior During Therapy [facility] for tasks assessed/performed             Past Medical History:  Diagnosis Date   Medical history non-contributory     Past Surgical History:  Procedure Laterality Date   NO PAST SURGERIES      There were no vitals filed for this visit.   Subjective Assessment - 09/15/20 1109     Subjective Yesterday took meloxicam but feeling better today.    Pertinent History none    Diagnostic tests per patient- had x-ray done on lumbar spine which showed "nothing significant"    Patient Stated Goals decrease pain    Currently in Pain? No/denies                               [facility] Adult PT Treatment/Exercise - 09/15/20 0001       Exercises   Exercises Lumbar      Lumbar Exercises: Stretches   Press Ups 10 reps    Press Ups Limitations 10x3"    Piriformis Stretch Right;Left;2 reps;30 seconds    Piriformis Stretch Limitations supine KTOS    Figure 4 Stretch 2 reps;30 seconds Both     Lumbar Exercises: Aerobic   Nustep L2 x 6 min      Lumbar Exercises: Seated   Sit to Stand 20 reps    Sit to Stand Limitations low mat table, 2nd set with yellow medicine ball      Lumbar Exercises: Supine   Bridge Compliant;15 reps    Bridge Limitations arms crossed      Lumbar Exercises: Quadruped    Madcat/Old Horse 12 reps    Madcat/Old Horse Limitations 12x3"    Other Quadruped Lumbar Exercises bird dog 10x2"                    PT Education - 09/15/20 1152     Education Details Education on sciatica, exercises to reduce sciatica, and anatomy.    Person(s) Educated Patient    Methods Explanation    Comprehension Verbalized understanding              PT Short Term Goals - 09/15/20 1154       PT SHORT TERM GOAL #1   Title Patient to be independent with initial HEP.    Time 3    Period Weeks    Status On-going    Target Date 09/21/20               PT Long Term Goals - 09/15/20 1154       PT LONG TERM GOAL #1   Title Patient to be independent with advanced HEP.    Time  6    Period Weeks    Status On-going      PT LONG TERM GOAL #2   Title Patient to demonstrate B LE strength >/=4+/5.    Time 6    Period Weeks    Status On-going      PT LONG TERM GOAL #3   Title Patient to demonstrate lumbar AROM WFL and without pain limiting.    Time 6    Period Weeks    Status On-going      PT LONG TERM GOAL #4   Title Patient to report tolerance for 1/2 a work shift without pain.    Time 6    Period Weeks    Status On-going      PT LONG TERM GOAL #5   Title Patient to recall and demonstrate good body mechanics when lifting 20 lb box from floor.    Time 6    Period Weeks    Status On-going                   Plan - 09/15/20 1155     Clinical Impression Statement Pt w/o any reports of pain this session. She notes that her pain comes and goes, she also wants to be able to reduce the use of her medication w/o pain. She notes relief with piriformis stretches and pelvic tilting in quadruped. Cueing required during pelvic tilts for isolated pelvic movement. She was able to comeplete STS with weighted ball today w/o any complaints. Pt w/o any further complaints and would benefit from continued PT to decrease flare ups from sciatica.    Personal  Factors and Comorbidities Age;Past/Current Experience;Profession;Time since onset of injury/illness/exacerbation    PT Frequency 1x / week    PT Duration 6 weeks    PT Treatment/Interventions ADLs/Self Care Home Management;Cryotherapy;Electrical Stimulation;Iontophoresis 56m/ml Dexamethasone;Moist Heat;Traction;Balance training;Therapeutic exercise;Therapeutic activities;Functional mobility training;Stair training;Gait training;Ultrasound;Neuromuscular re-education;Patient/family education;Manual techniques;Taping;Energy conservation;Dry needling;Passive range of motion    PT Next Visit Plan progress lumbopelvic mobility and hip strengthening, LE stretching    Consulted and Agree with Plan of Care Patient             Patient will benefit from skilled therapeutic intervention in order to improve the following deficits and impairments:  Pain,Increased fascial restricitons,Decreased strength,Decreased activity tolerance,Decreased balance,Increased muscle spasms,Improper body mechanics,Decreased range of motion,Impaired flexibility,Postural dysfunction  Visit Diagnosis: Chronic low back pain with right-sided sciatica, unspecified back pain laterality  Other symptoms and signs involving the musculoskeletal system  Abnormal posture     Problem List Patient Active Problem List   Diagnosis Date Noted   Normal spontaneous vaginal delivery 11/13/2016   Marijuana abuse 05/11/2016   Thyroid enlarged 05/08/2016    BArtist Pais PTA 09/15/2020, 12:01 PM  [place] [address] [place] [address]Phone: [phone]  Fax:  [phone] Name: Miranda SeefeldMRN: [id number] of Birth: [birth year] PHYSICAL THERAPY DISCHARGE SUMMARY  Visits from Start of Care: 2  Current functional level related to goals / functional outcomes: Unable to assess; patient did not return   Remaining deficits: Unable to  assess   Education / Equipment:  HEP  Plan: Patient agrees to discharge.  Patient goals were not met. Patient is being discharged due to not returning.    YJanene Harvey PT, DPT 11/03/20 9:19 AM

## 2020-09-20 DIAGNOSIS — J358 Other chronic diseases of tonsils and adenoids: Secondary | ICD-10-CM | POA: Diagnosis not present

## 2020-09-20 DIAGNOSIS — J351 Hypertrophy of tonsils: Secondary | ICD-10-CM | POA: Diagnosis not present

## 2020-09-20 DIAGNOSIS — R196 Halitosis: Secondary | ICD-10-CM | POA: Diagnosis not present

## 2020-09-24 ENCOUNTER — Ambulatory Visit: Payer: PRIVATE HEALTH INSURANCE | Admitting: Family

## 2020-10-21 ENCOUNTER — Encounter: Payer: Self-pay | Admitting: Family Medicine

## 2020-10-21 ENCOUNTER — Other Ambulatory Visit (HOSPITAL_COMMUNITY)
Admission: RE | Admit: 2020-10-21 | Discharge: 2020-10-21 | Disposition: A | Discharge status: Home / Self Care | Payer: No Typology Code available for payment source | Source: Ambulatory Visit | Attending: Family Medicine | Admitting: Family Medicine

## 2020-10-21 ENCOUNTER — Other Ambulatory Visit: Payer: Self-pay

## 2020-10-21 ENCOUNTER — Ambulatory Visit (INDEPENDENT_AMBULATORY_CARE_PROVIDER_SITE_OTHER): Payer: No Typology Code available for payment source | Admitting: Family Medicine

## 2020-10-21 VITALS — BP 116/67 | HR 77 | Wt 174.0 lb

## 2020-10-21 DIAGNOSIS — Z3202 Encounter for pregnancy test, result negative: Secondary | ICD-10-CM

## 2020-10-21 DIAGNOSIS — Z3042 Encounter for surveillance of injectable contraceptive: Secondary | ICD-10-CM

## 2020-10-21 DIAGNOSIS — Z01419 Encounter for gynecological examination (general) (routine) without abnormal findings: Secondary | ICD-10-CM | POA: Insufficient documentation

## 2020-10-21 LAB — POCT URINE PREGNANCY: Preg Test, Ur: NEGATIVE

## 2020-10-21 MED ORDER — MEDROXYPROGESTERONE ACETATE 150 MG/ML IM SUSP
150.0000 mg | Freq: Once | INTRAMUSCULAR | Status: AC
Start: 2020-10-21 — End: 2020-10-21
  Administered 2020-10-21: 150 mg via INTRAMUSCULAR

## 2020-10-21 NOTE — Addendum Note (Signed)
Addended by: Lorelle Gibbs L on: 10/21/2020 01:06 PM   Modules accepted: Orders

## 2020-10-21 NOTE — Addendum Note (Signed)
Addended by: Lorelle Gibbs L on: 10/21/2020 12:05 PM   Modules accepted: Orders

## 2020-10-21 NOTE — Progress Notes (Signed)
Pt presents for annual exam. Last pap was 4/21.

## 2020-10-21 NOTE — Progress Notes (Signed)
GYNECOLOGY ANNUAL PREVENTATIVE CARE ENCOUNTER NOTE  Subjective:   Miranda Drake is a 30 y.o. [phone] female here for a routine annual gynecologic exam.  Current complaints: would like to get back on Depo. Stopped it due to vaginal dryness.   Denies abnormal vaginal bleeding, discharge, pelvic pain, problems with intercourse or other gynecologic concerns.    Gynecologic History Patient's last menstrual period was 10/06/2020. Patient is sexually active  Contraception: Depo-Provera injections Last Pap: 2021. Results were: normal Last mammogram: n/a.  Obstetric History OB History  Gravida Para Term Preterm AB Living  3 2 2   1 2   SAB IAB Ectopic Multiple Live Births  1     0 2    # Outcome Date GA Lbr Len/2nd Weight Sex Delivery Anes PTL Lv  3 Term 11/12/16 [redacted]w[redacted]d 06:40 / 00:13 7 lb 3.9 oz (3.286 kg) F Vag-Spont EPI  LIV  2 Term 01/31/05   7 lb 11 oz (3.487 kg) M Vag-Spont   LIV  1 SAB             Past Medical History:  Diagnosis Date   Medical history non-contributory     Past Surgical History:  Procedure Laterality Date   NO PAST SURGERIES      Current Outpatient Medications on File Prior to Visit  Medication Sig Dispense Refill   meloxicam (MOBIC) 15 MG tablet Take 15 mg by mouth daily.     tiZANidine (ZANAFLEX) 2 MG tablet Take by mouth every 6 (six) hours as needed for muscle spasms.     clobetasol cream (TEMOVATE) 0.05 % Apply 1 application topically 2 (two) times daily. Apply to affected area (Patient not taking: No sig reported) 30 g 0   clobetasol cream (TEMOVATE) 0.05 % Apply 1 application topically 2 (two) times daily. Apply to affected area (Patient not taking: No sig reported) 30 g 2   metroNIDAZOLE (FLAGYL) 500 MG tablet Take 1 tablet (500 mg total) by mouth 2 (two) times daily. One po bid x 7 days (Patient not taking: No sig reported) 14 tablet 0   No current facility-administered medications on file prior to visit.    No Known Allergies  Social  History   Socioeconomic History   Marital status: Single    Spouse name: Not on file   Number of children: Not on file   Years of education: Not on file   Highest education level: Not on file  Occupational History   Not on file  Tobacco Use   Smoking status: Former    Packs/day: 0.30    Pack years: 0.00    Types: Cigarettes   Smokeless tobacco: Never  Vaping Use   Vaping Use: Never used  Substance and Sexual Activity   Alcohol use: Yes    Comment: occ- pt states none since early pregnancy   Drug use: Not Currently    Types: Marijuana    Comment: none since 6wks   Sexual activity: Yes    Birth control/protection: Injection    Comment: pregnant  Other Topics Concern   Not on file  Social History Narrative   Not on file   Social Determinants of Health   Financial Resource Strain: Not on file  Food Insecurity: Not on file  Transportation Needs: Not on file  Physical Activity: Not on file  Stress: Not on file  Social Connections: Not on file  Intimate Partner Violence: Not on file    Family History  Problem Relation Age of  Onset   Hyperlipidemia Mother    Hypertension Mother    Hyperlipidemia Sister     The following portions of the patient's history were reviewed and updated as appropriate: allergies, current medications, past family history, past medical history, past social history, past surgical history and problem list.  Review of Systems Pertinent items are noted in HPI.   Objective:  BP 116/67   Pulse 77   Wt 174 lb (78.9 kg)   LMP 10/06/2020   BMI 31.83 kg/m  Wt Readings from Last 3 Encounters:  10/21/20 174 lb (78.9 kg)  04/10/20 170 lb (77.1 kg)  09/11/19 173 lb (78.5 kg)     Chaperone present during exam  CONSTITUTIONAL: Well-developed, well-nourished female in no acute distress.  HENT:  Normocephalic, atraumatic, External right and left ear normal. Oropharynx is clear and moist EYES: Conjunctivae and EOM are normal. Pupils are equal,  round, and reactive to light. No scleral icterus.  NECK: Normal range of motion, supple, no masses.  Normal thyroid.   CARDIOVASCULAR: Normal heart rate noted, regular rhythm RESPIRATORY: Clear to auscultation bilaterally. Effort and breath sounds normal, no problems with respiration noted. BREASTS: Symmetric in size. No masses, skin changes, nipple drainage, or lymphadenopathy. ABDOMEN: Soft, normal bowel sounds, no distention noted.  No tenderness, rebound or guarding.  PELVIC: Normal appearing external genitalia; normal appearing vaginal mucosa and cervix.  No abnormal discharge noted.  Normal uterine size, no other palpable masses, no uterine or adnexal tenderness. MUSCULOSKELETAL: Normal range of motion. No tenderness.  No cyanosis, clubbing, or edema.  2+ distal pulses. SKIN: Skin is warm and dry. No rash noted. Not diaphoretic. No erythema. No pallor. NEUROLOGIC: Alert and oriented to person, place, and time. Normal reflexes, muscle tone coordination. No cranial nerve deficit noted. PSYCHIATRIC: Normal mood and affect. Normal behavior. Normal judgment and thought content.  Assessment:  Annual gynecologic examination with pap smear   Plan:  1. Well Woman Exam Will follow up results of pap smear and manage accordingly.   Routine preventative health maintenance measures emphasized. Please refer to After Visit Summary for other counseling recommendations.    Candelaria Celeste, DO [facility]

## 2020-10-25 LAB — CYTOLOGY - PAP
Adequacy: ABSENT
Comment: NEGATIVE
Diagnosis: NEGATIVE
High risk HPV: NEGATIVE

## 2020-11-18 ENCOUNTER — Other Ambulatory Visit: Payer: Self-pay

## 2020-11-18 ENCOUNTER — Encounter (HOSPITAL_BASED_OUTPATIENT_CLINIC_OR_DEPARTMENT_OTHER): Payer: Self-pay | Admitting: *Deleted

## 2020-11-18 ENCOUNTER — Other Ambulatory Visit (HOSPITAL_BASED_OUTPATIENT_CLINIC_OR_DEPARTMENT_OTHER): Payer: Self-pay

## 2020-11-18 ENCOUNTER — Emergency Department (HOSPITAL_BASED_OUTPATIENT_CLINIC_OR_DEPARTMENT_OTHER)
Admission: EM | Admit: 2020-11-18 | Discharge: 2020-11-18 | Disposition: A | Discharge status: Home / Self Care | Payer: No Typology Code available for payment source | Attending: Emergency Medicine | Admitting: Emergency Medicine

## 2020-11-18 DIAGNOSIS — M2560 Stiffness of unspecified joint, not elsewhere classified: Secondary | ICD-10-CM | POA: Diagnosis not present

## 2020-11-18 DIAGNOSIS — X58XXXA Exposure to other specified factors, initial encounter: Secondary | ICD-10-CM | POA: Insufficient documentation

## 2020-11-18 DIAGNOSIS — Z87891 Personal history of nicotine dependence: Secondary | ICD-10-CM | POA: Insufficient documentation

## 2020-11-18 DIAGNOSIS — S29012A Strain of muscle and tendon of back wall of thorax, initial encounter: Secondary | ICD-10-CM | POA: Diagnosis not present

## 2020-11-18 DIAGNOSIS — M436 Torticollis: Secondary | ICD-10-CM

## 2020-11-18 DIAGNOSIS — S29002A Unspecified injury of muscle and tendon of back wall of thorax, initial encounter: Secondary | ICD-10-CM | POA: Diagnosis present

## 2020-11-18 MED ORDER — ORPHENADRINE CITRATE ER 100 MG PO TB12
100.0000 mg | ORAL_TABLET | Freq: Two times a day (BID) | ORAL | 0 refills | Status: DC
  Filled 2020-11-18: qty 30, 15d supply, fill #0

## 2020-11-18 NOTE — ED Notes (Signed)
Pt in bed, pt c/o L sided neck pain and back pain.  Pt moving all extremities, resps even and unlabored

## 2020-11-18 NOTE — ED Provider Notes (Signed)
[facility] EMERGENCY DEPARTMENT Provider Note   CSN: [id number] Arrival date & time: 11/18/20  1050     History Chief Complaint  Patient presents with   Back Pain   Torticollis    Miranda Drake is a 30 y.o. female.  HPI Patient reports that yesterday evening she picked up her baby and when she got about her chest level she got a sharp pain in her middle thoracic back.  She reports that pain was intensely severe and radiated across her back and into her arm somewhat.  She reports her arms were painful, left slightly greater than right.  She reports she had to sit down and rest for few minutes.  She reports the pain started to abate somewhat.  She did take a dose of tizanidine which she had from a prior prescription.  She reports that allowed her to go to sleep when she is up to the night without difficulty.  She reports this morning there is still some stiffness in her central back.  She does not have severe pain going into her arms.  She reports that before lifting the baby and getting that pain, she was having some mild neck stiffness for about 2 days.  She reports her neck seem more stiff to turn from side to side and forward.  She was attributing it to having slept wrong or having her hair pulling too much.  No associated fever chills sore throat or constitutional symptoms.  Patient otherwise felt well.    Past Medical History:  Diagnosis Date   Medical history non-contributory     Patient Active Problem List   Diagnosis Date Noted   Normal spontaneous vaginal delivery 11/13/2016   Marijuana abuse 05/11/2016   Thyroid enlarged 05/08/2016    Past Surgical History:  Procedure Laterality Date   NO PAST SURGERIES     TONSILLECTOMY       OB History     Gravida  3   Para  2   Term  2   Preterm      AB  1   Living  2      SAB  1   IAB      Ectopic      Multiple  0   Live Births  2           Family History  Problem Relation Age of Onset    Hyperlipidemia Mother    Hypertension Mother    Hyperlipidemia Sister     Social History   Tobacco Use   Smoking status: Former    Packs/day: 0.30    Types: Cigarettes   Smokeless tobacco: Never  Vaping Use   Vaping Use: Some days  Substance Use Topics   Alcohol use: Yes    Comment: occ- pt states none since early pregnancy   Drug use: Not Currently    Types: Marijuana    Comment: none since 6wks    Home Medications Prior to Admission medications   Medication Sig Start Date End Date Taking? Authorizing Provider  orphenadrine (NORFLEX) 100 MG tablet Take 1 tablet (100 mg total) by mouth 2 (two) times daily. 11/18/20  Yes Arby Barrette, MD  tiZANidine (ZANAFLEX) 2 MG tablet Take by mouth every 6 (six) hours as needed for muscle spasms.   Yes [provider]  clobetasol cream (TEMOVATE) 0.05 % Apply 1 application topically 2 (two) times daily. Apply to affected area Patient not taking: No sig reported 09/01/19   Willodean Rosenthal,  MD  clobetasol cream (TEMOVATE) 0.05 % Apply 1 application topically 2 (two) times daily. Apply to affected area Patient not taking: No sig reported 09/11/19   Willodean Rosenthal, MD  meloxicam (MOBIC) 15 MG tablet Take 15 mg by mouth daily.    [provider]  metroNIDAZOLE (FLAGYL) 500 MG tablet Take 1 tablet (500 mg total) by mouth 2 (two) times daily. One po bid x 7 days Patient not taking: No sig reported 04/10/20   Charlynne Pander, MD    Allergies    Patient has no known allergies.  Review of Systems   Review of Systems 10 systems reviewed negative except as per HPI Physical Exam Updated Vital Signs BP 139/89 (BP Location: Right Arm)   Pulse 84   Temp 98.7 F (37.1 C) (Oral)   Resp 18   Ht 5\' 2"  (1.575 m)   Wt 78.9 kg   SpO2 98%   BMI 31.81 kg/m   Physical Exam Constitutional:      Appearance: Normal appearance.  HENT:     Mouth/Throat:     Pharynx: Oropharynx is clear.  Eyes:     Extraocular  Movements: Extraocular movements intact.  Cardiovascular:     Rate and Rhythm: Normal rate and regular rhythm.  Pulmonary:     Effort: Pulmonary effort is normal.     Breath sounds: Normal breath sounds.  Abdominal:     General: There is no distension.     Palpations: Abdomen is soft.     Tenderness: There is no abdominal tenderness. There is no guarding.  Musculoskeletal:     Comments: Mild discomfort to palpation of the paraspinous muscle bodies around the upper thoracic back more to the left.  Patient can turn head left and right and forward flexion without significant pain.  Strength is good.  No peripheral edema or calf tenderness.  Extremities are well-formed and symmetric.  Skin:    General: Skin is warm and dry.  Neurological:     General: No focal deficit present.     Mental Status: She is alert and oriented to person, place, and time.     Sensory: No sensory deficit.     Motor: No weakness.  Psychiatric:        Mood and Affect: Mood normal.    ED Results / Procedures / Treatments   Labs (all labs ordered are listed, but only abnormal results are displayed) Labs Reviewed - No data to display  EKG None  Radiology No results found.  Procedures Procedures   Medications Ordered in ED Medications - No data to display  ED Course  I have reviewed the triage vital signs and the nursing notes.  Pertinent labs & imaging results that were available during my care of the patient were reviewed by me and considered in my medical decision making (see chart for details).    MDM Rules/Calculators/A&P                           Patient presents as outlined.  She had a mildly stiff neck for about 2 days attributing it to sleeping wrong on her hair.  No signs of infectious etiology.  Patient does not have sore throat, difficulty swallowing, headache, fever or general constitutional symptoms.  She then lifted her child yesterday evening and got a severe sharp pain in her central  upper back which radiated to her arms.  She reports it began to abate and she  rested.  She was able to sleep after taking tizanidine last night.  Today exam is benign without any signs of neurovascular dysfunction.  Patient has reproducible thoracic musculoskeletal pain but good function.  Plan is to continue symptomatic control with muscle relaxers, ibuprofen and Tylenol.  This and return precautions have been reviewed with the patient. Final Clinical Impression(s) / ED Diagnoses Final diagnoses:  Strain of thoracic back region  Acute muscle stiffness of neck    Rx / DC Orders ED Discharge Orders          Ordered    orphenadrine (NORFLEX) 100 MG tablet  2 times daily        11/18/20 1156             Arby Barrette, MD 11/18/20 1203

## 2020-11-18 NOTE — ED Notes (Signed)
Pt states that she is driving home, states that she is ready to go, ice pack given, pt ambulatory from dpt

## 2020-11-18 NOTE — ED Triage Notes (Signed)
Neck and back pain x 2 days. Pain increases with movement. She feels her hair was pulled while she was sleeping.

## 2020-11-18 NOTE — Discharge Instructions (Addendum)
1.  At this time symptoms are suggestive of muscle strain and possibly pinched nerve in the back.  Take ibuprofen 600 mg every 8 hours for pain.  You may also take Norflex, a muscle relaxer twice daily as prescribed.  It is also safe to add over-the-counter acetaminophen (Tylenol per package instructions) for additional pain control of back and neck.  This muscle relaxer may be less sedating than your tizanidine. 2.  Return to emergency department if you get numbness tingling or weakness in your arms or legs.  Schedule follow-up recheck with your doctor in 7 to 10 days.

## 2020-11-19 ENCOUNTER — Telehealth: Payer: Self-pay

## 2020-11-19 NOTE — Telephone Encounter (Signed)
Transition Care Management Unsuccessful Follow-up Telephone Call  Date of discharge and from where:  11/18/2020-[facility]  Attempts:  1st Attempt  Reason for unsuccessful TCM follow-up call:  Left voice message

## 2020-11-22 NOTE — Telephone Encounter (Signed)
Transition Care Management Unsuccessful Follow-up Telephone Call  Date of discharge and from where:  11/18/2020-[facility]  Attempts:  2nd Attempt  Reason for unsuccessful TCM follow-up call:  Left voice message

## 2020-11-23 NOTE — Telephone Encounter (Signed)
Transition Care Management Unsuccessful Follow-up Telephone Call  Date of discharge and from where:  11/18/2020 from [facility]  Attempts:  3rd Attempt  Reason for unsuccessful TCM follow-up call:  Unable to reach patient

## 2021-04-04 ENCOUNTER — Encounter (HOSPITAL_BASED_OUTPATIENT_CLINIC_OR_DEPARTMENT_OTHER): Payer: Self-pay | Admitting: Emergency Medicine

## 2021-04-04 ENCOUNTER — Other Ambulatory Visit: Payer: Self-pay

## 2021-04-04 ENCOUNTER — Emergency Department (HOSPITAL_BASED_OUTPATIENT_CLINIC_OR_DEPARTMENT_OTHER)
Admission: EM | Admit: 2021-04-04 | Discharge: 2021-04-04 | Disposition: A | Discharge status: Home / Self Care | Payer: No Typology Code available for payment source | Attending: Emergency Medicine | Admitting: Emergency Medicine

## 2021-04-04 DIAGNOSIS — Z87891 Personal history of nicotine dependence: Secondary | ICD-10-CM | POA: Diagnosis not present

## 2021-04-04 DIAGNOSIS — O4690 Antepartum hemorrhage, unspecified, unspecified trimester: Secondary | ICD-10-CM

## 2021-04-04 DIAGNOSIS — O26851 Spotting complicating pregnancy, first trimester: Secondary | ICD-10-CM | POA: Insufficient documentation

## 2021-04-04 DIAGNOSIS — Z3A01 Less than 8 weeks gestation of pregnancy: Secondary | ICD-10-CM | POA: Insufficient documentation

## 2021-04-04 LAB — URINALYSIS, ROUTINE W REFLEX MICROSCOPIC
Bilirubin Urine: NEGATIVE
Glucose, UA: NEGATIVE mg/dL
Hgb urine dipstick: NEGATIVE
Ketones, ur: NEGATIVE mg/dL
Leukocytes,Ua: NEGATIVE
Nitrite: NEGATIVE
Protein, ur: NEGATIVE mg/dL
Specific Gravity, Urine: 1.025 (ref 1.005–1.030)
pH: 7.5 (ref 5.0–8.0)

## 2021-04-04 LAB — PREGNANCY, URINE: Preg Test, Ur: POSITIVE — AB

## 2021-04-04 NOTE — Discharge Instructions (Signed)
You have 6-week pregnancy right now.  Please follow-up with OB doctor.  You may want to consider RhoGAM since you have bleeding.  Return to ER if you have worse vaginal bleeding, pelvic pain, vomiting.

## 2021-04-04 NOTE — ED Notes (Signed)
No acute distress noted upon this RN's departure of patient. Verified discharge paperwork with name and DOB. Vital signs stable. Patient taken to checkout window. Discharge paperwork discussed with patient. No further questions voiced upon discharge.  ° °

## 2021-04-04 NOTE — ED Triage Notes (Signed)
Had a positive preg tests , about 5 weeks, has had some spotting last week  but it has stopped , LMP first of NOv , breast tender , G 4 P2  A1 L2

## 2021-04-04 NOTE — ED Provider Notes (Signed)
[facility] EMERGENCY DEPARTMENT Provider Note   CSN: [id number] Arrival date & time: 04/04/21  1813     History Chief Complaint  Patient presents with   Vaginal Bleeding    Miranda Drake is a 30 y.o. female here presenting with positive pregnancy test and vaginal spotting.  Patient states that she is G4 P2 A1.  She states that she is about 4 to [redacted] weeks pregnant.  She had some dark spotting 3 days ago.  She states that the spotting has stopped.  She denies any gush of fluid.  Denies any abdominal pain.  Denies any blood in her urine.  Patient states that her blood type is O-.  She required RhoGAM previously  The history is provided by the patient.      Past Medical History:  Diagnosis Date   Medical history non-contributory     Patient Active Problem List   Diagnosis Date Noted   Normal spontaneous vaginal delivery 11/13/2016   Marijuana abuse 05/11/2016   Thyroid enlarged 05/08/2016    Past Surgical History:  Procedure Laterality Date   NO PAST SURGERIES     TONSILLECTOMY       OB History     Gravida  3   Para  2   Term  2   Preterm      AB  1   Living  2      SAB  1   IAB      Ectopic      Multiple  0   Live Births  2           Family History  Problem Relation Age of Onset   Hyperlipidemia Mother    Hypertension Mother    Hyperlipidemia Sister     Social History   Tobacco Use   Smoking status: Former    Packs/day: 0.30    Types: Cigarettes   Smokeless tobacco: Never  Vaping Use   Vaping Use: Some days  Substance Use Topics   Alcohol use: Yes    Comment: occ- pt states none since early pregnancy   Drug use: Not Currently    Types: Marijuana    Comment: none since 6wks    Home Medications Prior to Admission medications   Medication Sig Start Date End Date Taking? Authorizing Provider  clobetasol cream (TEMOVATE) 0.05 % Apply 1 application topically 2 (two) times daily. Apply to affected area Patient not  taking: No sig reported 09/01/19   Willodean Rosenthal, MD  clobetasol cream (TEMOVATE) 0.05 % Apply 1 application topically 2 (two) times daily. Apply to affected area Patient not taking: No sig reported 09/11/19   Willodean Rosenthal, MD  meloxicam (MOBIC) 15 MG tablet Take 15 mg by mouth daily.    [provider]  metroNIDAZOLE (FLAGYL) 500 MG tablet Take 1 tablet (500 mg total) by mouth 2 (two) times daily. One po bid x 7 days Patient not taking: No sig reported 04/10/20   Charlynne Pander, MD  orphenadrine (NORFLEX) 100 MG tablet Take 1 tablet (100 mg total) by mouth 2 (two) times daily. 11/18/20   Arby Barrette, MD  tiZANidine (ZANAFLEX) 2 MG tablet Take by mouth every 6 (six) hours as needed for muscle spasms.    [provider]    Allergies    Patient has no known allergies.  Review of Systems   Review of Systems  Genitourinary:  Positive for vaginal bleeding.  All other systems reviewed and are negative.  Physical  Exam Updated Vital Signs BP 115/76 (BP Location: Left Arm)    Pulse 80    Temp 97.8 F (36.6 C) (Oral)    Resp 18    Ht 5\' 1"  (1.549 m)    Wt 77.1 kg    SpO2 100%    BMI 32.12 kg/m   Physical Exam Vitals and nursing note reviewed.  Constitutional:      Appearance: Normal appearance.  HENT:     Head: Normocephalic.     Mouth/Throat:     Mouth: Mucous membranes are moist.  Eyes:     Extraocular Movements: Extraocular movements intact.     Pupils: Pupils are equal, round, and reactive to light.  Cardiovascular:     Rate and Rhythm: Normal rate and regular rhythm.     Heart sounds: Normal heart sounds.  Pulmonary:     Effort: Pulmonary effort is normal.     Breath sounds: Normal breath sounds.  Abdominal:     General: Abdomen is flat.     Palpations: Abdomen is soft.  Genitourinary:    Comments: Patient deferred Musculoskeletal:        General: Normal range of motion.     Cervical back: Normal range of motion and neck supple.   Skin:    General: Skin is warm.     Capillary Refill: Capillary refill takes less than 2 seconds.  Neurological:     General: No focal deficit present.     Mental Status: She is alert and oriented to person, place, and time.  Psychiatric:        Mood and Affect: Mood normal.        Behavior: Behavior normal.    ED Results / Procedures / Treatments   Labs (all labs ordered are listed, but only abnormal results are displayed) Labs Reviewed  PREGNANCY, URINE - Abnormal; Notable for the following components:      Result Value   Preg Test, Ur POSITIVE (*)    All other components within normal limits  URINALYSIS, ROUTINE W REFLEX MICROSCOPIC    EKG None  Radiology No results found.  Procedures Procedures  EMERGENCY DEPARTMENT PREGNANCY "Study: Limited Ultrasound of the Pelvis for Pregnancy"  INDICATIONS:Pregnancy(required) Multiple views of the uterus and pelvic cavity were obtained in real-time with a multi-frequency probe.  APPROACH:Transabdominal  PERFORMED BY: Myself IMAGES ARCHIVED?: Yes LIMITATIONS: Body habitus PREGNANCY FREE FLUID: None ADNEXAL FINDINGS:Left ovary not seen and Right ovary not seen GESTATIONAL AGE, ESTIMATE: 6 weeks 4 days  FETAL HEART RATE: 170 INTERPRETATION: Intrauterine gestational sac noted, Yolk sac noted, Fetal pole present, and Fetal heart activity seen     Medications Ordered in ED Medications - No data to display  ED Course  I have reviewed the triage vital signs and the nursing notes.  Pertinent labs & imaging results that were available during my care of the patient were reviewed by me and considered in my medical decision making (see chart for details).    MDM Rules/Calculators/A&P                         Miranda Drake is a 30 y.o. female here presenting with vaginal bleeding.  Patient is about [redacted] weeks pregnant.  Ultrasound confirms 6-week 4-day pregnancy with normal fetal heart rate.  Patient's blood type is O  negative.  I offered RhoGAM.  However patient states that she is not sure if she wants to keep the pregnancy right now.  I  told her that RhoGAM will help reduce chances of miscarriage both in this pregnancy and future pregnancies.  She states that right now she does not want it.  She states that she can follow-up with OB doctor and I encouraged her to discuss this with her OB doctor.     Final Clinical Impression(s) / ED Diagnoses Final diagnoses:  None    Rx / DC Orders ED Discharge Orders     None        Charlynne Pander, MD 04/04/21 2027

## 2021-08-16 ENCOUNTER — Encounter: Payer: Self-pay | Admitting: General Practice

## 2022-01-06 DIAGNOSIS — Z0389 Encounter for observation for other suspected diseases and conditions ruled out: Secondary | ICD-10-CM | POA: Diagnosis not present

## 2022-01-11 ENCOUNTER — Ambulatory Visit: Payer: Medicaid Other | Admitting: Family Medicine

## 2022-04-10 ENCOUNTER — Emergency Department (HOSPITAL_BASED_OUTPATIENT_CLINIC_OR_DEPARTMENT_OTHER)
Admission: EM | Admit: 2022-04-10 | Discharge: 2022-04-10 | Disposition: A | Discharge status: Home / Self Care | Payer: Medicaid Other | Attending: Emergency Medicine | Admitting: Emergency Medicine

## 2022-04-10 ENCOUNTER — Other Ambulatory Visit: Payer: Self-pay

## 2022-04-10 ENCOUNTER — Encounter (HOSPITAL_BASED_OUTPATIENT_CLINIC_OR_DEPARTMENT_OTHER): Payer: Self-pay

## 2022-04-10 DIAGNOSIS — J020 Streptococcal pharyngitis: Secondary | ICD-10-CM

## 2022-04-10 DIAGNOSIS — Z1152 Encounter for screening for COVID-19: Secondary | ICD-10-CM | POA: Insufficient documentation

## 2022-04-10 DIAGNOSIS — J029 Acute pharyngitis, unspecified: Secondary | ICD-10-CM | POA: Diagnosis present

## 2022-04-10 LAB — RESP PANEL BY RT-PCR (RSV, FLU A&B, COVID)  RVPGX2
Influenza A by PCR: NEGATIVE
Influenza B by PCR: NEGATIVE
Resp Syncytial Virus by PCR: NEGATIVE
SARS Coronavirus 2 by RT PCR: NEGATIVE

## 2022-04-10 LAB — GROUP A STREP BY PCR: Group A Strep by PCR: DETECTED — AB

## 2022-04-10 MED ORDER — PENICILLIN G BENZATHINE 1200000 UNIT/2ML IM SUSY
1.2000 10*6.[IU] | PREFILLED_SYRINGE | Freq: Once | INTRAMUSCULAR | Status: AC
  Administered 2022-04-10: 1.2 10*6.[IU] via INTRAMUSCULAR
  Filled 2022-04-10: qty 2

## 2022-04-10 MED ORDER — ACETAMINOPHEN 325 MG PO TABS
650.0000 mg | ORAL_TABLET | Freq: Once | ORAL | Status: AC | PRN
  Administered 2022-04-10: 650 mg via ORAL
  Filled 2022-04-10: qty 2

## 2022-04-10 NOTE — Discharge Instructions (Signed)
Thank you for letting us take care of you today.  As discussed, you tested positive for strep throat. You were given an antibiotic shot to treat this. Please take over the counter Tylenol and ibuprofen as needed for pain. Should you continue to have symptoms, please follow-up with a primary care provider for re-evaluation.  If you develop shortness of breath, chest pain, throat swelling, or other concerning symptoms, please return to your nearest emergency department for re-evaluation.

## 2022-04-10 NOTE — ED Notes (Signed)
Pt discharged to home. Discharge instructions have been discussed with patient and/or family members. Pt verbally acknowledges understanding d/c instructions, and endorses comprehension to checkout at registration before leaving.  °

## 2022-04-10 NOTE — ED Triage Notes (Signed)
Pt presents with complaint of sore throat, HA and body aches

## 2022-04-10 NOTE — ED Provider Notes (Signed)
[facility] EMERGENCY DEPARTMENT Provider Note   CSN: [id number] Arrival date & time: 04/10/22  1600     History  Chief Complaint  Patient presents with   Sore Throat    Miranda Drake is a 31 y.o. female presenting for sore throat, fever, headache, and generalized body aches that started this morning. She has not taken any medication for symptoms before. Brother tested positive for strep throat. She denies nausea, vomiting, abdominal pain, cough, congestion, or trouble swallowing. She has had strep throat once before several years ago that was treated with bicillin injection.       Home Medications Prior to Admission medications   Medication Sig Start Date End Date Taking? Authorizing Provider  clobetasol cream (TEMOVATE) 0.05 % Apply 1 application topically 2 (two) times daily. Apply to affected area Patient not taking: No sig reported 09/01/19   Willodean Rosenthal, MD  clobetasol cream (TEMOVATE) 0.05 % Apply 1 application topically 2 (two) times daily. Apply to affected area Patient not taking: No sig reported 09/11/19   Willodean Rosenthal, MD  meloxicam (MOBIC) 15 MG tablet Take 15 mg by mouth daily.    [provider]  metroNIDAZOLE (FLAGYL) 500 MG tablet Take 1 tablet (500 mg total) by mouth 2 (two) times daily. One po bid x 7 days Patient not taking: No sig reported 04/10/20   Charlynne Pander, MD  orphenadrine (NORFLEX) 100 MG tablet Take 1 tablet (100 mg total) by mouth 2 (two) times daily. 11/18/20   Arby Barrette, MD  tiZANidine (ZANAFLEX) 2 MG tablet Take by mouth every 6 (six) hours as needed for muscle spasms.    [provider]      Allergies    Patient has no known allergies.    Review of Systems   Review of Systems  Constitutional:  Positive for fever.  HENT:  Positive for sore throat. Negative for congestion, ear pain and rhinorrhea.   Eyes:  Negative for pain and visual disturbance.  Respiratory:  Negative for cough  and shortness of breath.   Cardiovascular:  Negative for chest pain and palpitations.  Gastrointestinal:  Negative for abdominal pain, diarrhea, nausea and vomiting.  Genitourinary:  Negative for dysuria and hematuria.  Musculoskeletal:  Positive for myalgias. Negative for arthralgias and back pain.  Skin:  Negative for color change and rash.  Neurological:  Positive for headaches. Negative for seizures and syncope.  All other systems reviewed and are negative.   Physical Exam Updated Vital Signs BP 109/71 (BP Location: Right Arm)   Pulse 97   Temp (!) 100.6 F (38.1 C) (Oral)   Resp 18   Ht 5\' 1"  (1.549 m)   Wt 79.4 kg   LMP 03/18/2022   SpO2 98%   BMI 33.07 kg/m  Physical Exam Vitals and nursing note reviewed.  Constitutional:      General: She is not in acute distress.    Appearance: Normal appearance. She is not toxic-appearing.  HENT:     Head: Normocephalic and atraumatic.     Mouth/Throat:     Mouth: Mucous membranes are moist.     Pharynx: Uvula midline. Oropharyngeal exudate (a few tiny white exusates to posterior pharynx) and posterior oropharyngeal erythema (moderate) present. No pharyngeal swelling or uvula swelling.  Eyes:     Conjunctiva/sclera: Conjunctivae normal.  Cardiovascular:     Rate and Rhythm: Normal rate and regular rhythm.     Heart sounds: No murmur heard. Pulmonary:     Effort: Pulmonary  effort is normal.     Breath sounds: Normal breath sounds.  Abdominal:     General: Abdomen is flat.     Palpations: Abdomen is soft.     Tenderness: There is no abdominal tenderness.  Musculoskeletal:        General: Normal range of motion.     Cervical back: Neck supple.     Right lower leg: No edema.     Left lower leg: No edema.  Lymphadenopathy:     Cervical: No cervical adenopathy.  Skin:    General: Skin is warm and dry.     Capillary Refill: Capillary refill takes less than 2 seconds.  Neurological:     Mental Status: She is alert. Mental  status is at baseline.  Psychiatric:        Behavior: Behavior normal.     ED Results / Procedures / Treatments   Labs (all labs ordered are listed, but only abnormal results are displayed) Labs Reviewed  GROUP A STREP BY PCR - Abnormal; Notable for the following components:      Result Value   Group A Strep by PCR DETECTED (*)    All other components within normal limits  RESP PANEL BY RT-PCR (RSV, FLU A&B, COVID)  [id number]    EKG None  Radiology No results found.  Procedures None  Medications Ordered in ED Medications  penicillin g benzathine (BICILLIN LA) 1200000 UNIT/2ML injection 1.2 Million Units (has no administration in time range)  acetaminophen (TYLENOL) tablet 650 mg (650 mg Oral Given 04/10/22 1658)    ED Course/ Medical Decision Making/ A&P                           Medical Decision Making Risk OTC drugs.   31 year old female presents to ED with acute strep pharyngitis. Positive sick contact with same. Symptoms started this morning. Fever treated in ED with Tylenol. Pt tolerating secretions, PO intake, and without nausea or vomiting. Moderate erythema to posterior pharynx with a few scattered exudates but airway is patent with no signs of throat or uvula swelling. Will treat with IM injection Bicillin. Pt given strict return precautions. All questions answered.          Final Clinical Impression(s) / ED Diagnoses Final diagnoses:  Strep pharyngitis    Rx / DC Orders ED Discharge Orders     None         Tonette Lederer, PA-C 04/10/22 1815    Virgina Norfolk, DO 04/10/22 2011

## 2022-06-06 DIAGNOSIS — Z0000 Encounter for general adult medical examination without abnormal findings: Secondary | ICD-10-CM | POA: Diagnosis not present

## 2022-06-06 DIAGNOSIS — Z124 Encounter for screening for malignant neoplasm of cervix: Secondary | ICD-10-CM | POA: Diagnosis not present

## 2022-06-06 DIAGNOSIS — Z113 Encounter for screening for infections with a predominantly sexual mode of transmission: Secondary | ICD-10-CM | POA: Diagnosis not present

## 2022-06-06 DIAGNOSIS — Z01419 Encounter for gynecological examination (general) (routine) without abnormal findings: Secondary | ICD-10-CM | POA: Diagnosis not present

## 2022-06-06 DIAGNOSIS — Z3201 Encounter for pregnancy test, result positive: Secondary | ICD-10-CM | POA: Diagnosis not present

## 2022-06-06 DIAGNOSIS — Z1151 Encounter for screening for human papillomavirus (HPV): Secondary | ICD-10-CM | POA: Diagnosis not present

## 2022-06-13 DIAGNOSIS — O3680X0 Pregnancy with inconclusive fetal viability, not applicable or unspecified: Secondary | ICD-10-CM | POA: Diagnosis not present

## 2022-06-13 DIAGNOSIS — Z3A11 11 weeks gestation of pregnancy: Secondary | ICD-10-CM | POA: Diagnosis not present

## 2022-08-08 DIAGNOSIS — Z3A19 19 weeks gestation of pregnancy: Secondary | ICD-10-CM | POA: Diagnosis not present

## 2022-08-08 DIAGNOSIS — Z3689 Encounter for other specified antenatal screening: Secondary | ICD-10-CM | POA: Diagnosis not present

## 2022-08-08 DIAGNOSIS — Z3482 Encounter for supervision of other normal pregnancy, second trimester: Secondary | ICD-10-CM | POA: Diagnosis not present

## 2022-08-08 DIAGNOSIS — O0932 Supervision of pregnancy with insufficient antenatal care, second trimester: Secondary | ICD-10-CM | POA: Diagnosis not present

## 2022-08-21 DIAGNOSIS — Z3A21 21 weeks gestation of pregnancy: Secondary | ICD-10-CM | POA: Diagnosis not present

## 2022-08-21 DIAGNOSIS — O4702 False labor before 37 completed weeks of gestation, second trimester: Secondary | ICD-10-CM | POA: Diagnosis not present

## 2022-08-21 DIAGNOSIS — O26892 Other specified pregnancy related conditions, second trimester: Secondary | ICD-10-CM | POA: Diagnosis not present

## 2022-08-25 ENCOUNTER — Other Ambulatory Visit (HOSPITAL_COMMUNITY): Payer: Self-pay

## 2022-09-05 DIAGNOSIS — Z362 Encounter for other antenatal screening follow-up: Secondary | ICD-10-CM | POA: Diagnosis not present

## 2022-09-05 DIAGNOSIS — O0932 Supervision of pregnancy with insufficient antenatal care, second trimester: Secondary | ICD-10-CM | POA: Diagnosis not present

## 2022-09-05 DIAGNOSIS — O99212 Obesity complicating pregnancy, second trimester: Secondary | ICD-10-CM | POA: Diagnosis not present

## 2022-09-05 DIAGNOSIS — O4442 Low lying placenta NOS or without hemorrhage, second trimester: Secondary | ICD-10-CM | POA: Diagnosis not present

## 2022-09-05 DIAGNOSIS — Z3A24 24 weeks gestation of pregnancy: Secondary | ICD-10-CM | POA: Diagnosis not present

## 2022-09-10 ENCOUNTER — Ambulatory Visit
Admission: EM | Admit: 2022-09-10 | Discharge: 2022-09-10 | Disposition: A | Discharge status: Home / Self Care | Payer: 59 | Attending: Urgent Care | Admitting: Urgent Care

## 2022-09-10 DIAGNOSIS — B354 Tinea corporis: Secondary | ICD-10-CM

## 2022-09-10 DIAGNOSIS — O98912 Unspecified maternal infectious and parasitic disease complicating pregnancy, second trimester: Secondary | ICD-10-CM

## 2022-09-10 MED ORDER — KETOCONAZOLE 2 % EX CREA: 1.0000 | TOPICAL_CREAM | Freq: Every day | CUTANEOUS | 0 refills | Status: DC

## 2022-09-10 NOTE — ED Provider Notes (Signed)
[facility] - URGENT [facility]ARE CENTER  Note:  This document was prepared using Conservation officer, historic buildings and may include unintentional dictation errors.  MRN: [id number] DOB: 22-Dec-1990  Subjective:   Miranda Drake is a 32 y.o. female presenting for 1 week history of persistent itchy rash over the lateral aspect of her left forearm.  Patient has been using 2 different types of steroid creams and a very generic antifungal cream without much relief.  The steroid cream can help with the itching but feels like it is making the rash worse.  No drainage of pus or bleeding, fever.  Patient is [redacted] weeks pregnant.  No current facility-administered medications for this encounter.  Current Outpatient Medications:    clobetasol cream (TEMOVATE) 0.05 %, Apply 1 application topically 2 (two) times daily. Apply to affected area (Patient not taking: Reported on 08/31/2020), Disp: 30 g, Rfl: 0   clobetasol cream (TEMOVATE) 0.05 %, Apply 1 application topically 2 (two) times daily. Apply to affected area (Patient not taking: Reported on 08/31/2020), Disp: 30 g, Rfl: 2   meloxicam (MOBIC) 15 MG tablet, Take 15 mg by mouth daily., Disp: , Rfl:    metroNIDAZOLE (FLAGYL) 500 MG tablet, Take 1 tablet (500 mg total) by mouth 2 (two) times daily. One po bid x 7 days (Patient not taking: Reported on 08/31/2020), Disp: 14 tablet, Rfl: 0   orphenadrine (NORFLEX) 100 MG tablet, Take 1 tablet (100 mg total) by mouth 2 (two) times daily., Disp: 30 tablet, Rfl: 0   tiZANidine (ZANAFLEX) 2 MG tablet, Take by mouth every 6 (six) hours as needed for muscle spasms., Disp: , Rfl:    No Known Allergies  Past Medical History:  Diagnosis Date   Medical history non-contributory      Past Surgical History:  Procedure Laterality Date   NO PAST SURGERIES     TONSILLECTOMY      Family History  Problem Relation Age of Onset   Hyperlipidemia Mother    Hypertension Mother    Hyperlipidemia Sister     Social History    Tobacco Use   Smoking status: Former    Packs/day: .3    Types: Cigarettes   Smokeless tobacco: Never  Vaping Use   Vaping Use: Former  Substance Use Topics   Alcohol use: Not Currently   Drug use: Not Currently    Types: Marijuana    ROS   Objective:   Vitals: BP 117/75 (BP Location: Right Arm)   Pulse 85   Temp 98.4 F (36.9 C) (Oral)   Resp 16   LMP 03/18/2022   SpO2 96%   Physical Exam Constitutional:      General: She is not in acute distress.    Appearance: Normal appearance. She is well-developed. She is not ill-appearing, toxic-appearing or diaphoretic.  HENT:     Head: Normocephalic and atraumatic.     Nose: Nose normal.     Mouth/Throat:     Mouth: Mucous membranes are moist.  Eyes:     General: No scleral icterus.       Right eye: No discharge.        Left eye: No discharge.     Extraocular Movements: Extraocular movements intact.  Cardiovascular:     Rate and Rhythm: Normal rate.  Pulmonary:     Effort: Pulmonary effort is normal.  Skin:    General: Skin is warm and dry.     Findings: Rash (~1cm annular lesion with central clearing over the lateral/radial  dorsal aspect of her left forearm, has surrounding satellite lesions) present.  Neurological:     General: No focal deficit present.     Mental Status: She is alert and oriented to person, place, and time.  Psychiatric:        Mood and Affect: Mood normal.        Behavior: Behavior normal.        Thought Content: Thought content normal.        Judgment: Judgment normal.     Assessment and Plan :   PDMP not reviewed this encounter.  1. Tinea corporis   2. Infection during pregnancy in second trimester     Recommended managing for tinea corporis.  Considered scabies but will defer treatment for this for now.  Recommended she avoid steroid creams. Counseled patient on potential for adverse effects with medications prescribed/recommended today, ER and return-to-clinic precautions  discussed, patient verbalized understanding.    Wallis Bamberg, [place] 09/10/22 1419

## 2022-09-10 NOTE — ED Triage Notes (Addendum)
Pt c/o rash to LUE x 1 week-using "antifungal,hydrocortisone and cortisol"-NAD-steady gait-pt is [redacted] weeks pregnant

## 2022-09-14 ENCOUNTER — Ambulatory Visit
Admission: EM | Admit: 2022-09-14 | Discharge: 2022-09-14 | Disposition: A | Discharge status: Home / Self Care | Payer: 59 | Attending: Urgent Care | Admitting: Urgent Care

## 2022-09-14 DIAGNOSIS — Z3A24 24 weeks gestation of pregnancy: Secondary | ICD-10-CM

## 2022-09-14 DIAGNOSIS — M542 Cervicalgia: Secondary | ICD-10-CM

## 2022-09-14 DIAGNOSIS — M549 Dorsalgia, unspecified: Secondary | ICD-10-CM | POA: Diagnosis not present

## 2022-09-14 MED ORDER — ACETAMINOPHEN 325 MG PO TABS: 650.0000 mg | ORAL_TABLET | Freq: Four times a day (QID) | ORAL | 0 refills | Status: DC | PRN

## 2022-09-14 NOTE — ED Triage Notes (Signed)
Pt c/o upper back and posterior neck pain started this am after sexual intercourse/arching her back ~7am-no pain meds PTA-NAD-slow gait-pt is [redacted] weeks pregnant

## 2022-09-14 NOTE — ED Provider Notes (Signed)
[facility] - [facility]  Note:  This document was prepared using Conservation officer, historic buildings and may include unintentional dictation errors.  MRN: [id number] DOB: 1990-06-14  Subjective:   Miranda Drake is a 32 y.o. female presenting for upper back, thoracic back and right-sided neck/trapezius pain.  Symptoms started after she had sex this morning.  Denies a fall, trauma, weakness, numbness or tingling.  Patient is [redacted] weeks pregnant.  No history of musculoskeletal disorders.  No current facility-administered medications for this encounter.  Current Outpatient Medications:    clobetasol cream (TEMOVATE) 0.05 %, Apply 1 application topically 2 (two) times daily. Apply to affected area (Patient not taking: Reported on 08/31/2020), Disp: 30 g, Rfl: 0   clobetasol cream (TEMOVATE) 0.05 %, Apply 1 application topically 2 (two) times daily. Apply to affected area (Patient not taking: Reported on 08/31/2020), Disp: 30 g, Rfl: 2   ketoconazole (NIZORAL) 2 % cream, Apply 1 Application topically daily., Disp: 60 g, Rfl: 0   meloxicam (MOBIC) 15 MG tablet, Take 15 mg by mouth daily., Disp: , Rfl:    metroNIDAZOLE (FLAGYL) 500 MG tablet, Take 1 tablet (500 mg total) by mouth 2 (two) times daily. One po bid x 7 days (Patient not taking: Reported on 08/31/2020), Disp: 14 tablet, Rfl: 0   orphenadrine (NORFLEX) 100 MG tablet, Take 1 tablet (100 mg total) by mouth 2 (two) times daily., Disp: 30 tablet, Rfl: 0   tiZANidine (ZANAFLEX) 2 MG tablet, Take by mouth every 6 (six) hours as needed for muscle spasms., Disp: , Rfl:    No Known Allergies  Past Medical History:  Diagnosis Date   Medical history non-contributory      Past Surgical History:  Procedure Laterality Date   NO PAST SURGERIES     TONSILLECTOMY      Family History  Problem Relation Age of Onset   Hyperlipidemia Mother    Hypertension Mother    Hyperlipidemia Sister     Social History   Tobacco Use   Smoking  status: Former    Packs/day: .3    Types: Cigarettes   Smokeless tobacco: Never  Vaping Use   Vaping Use: Former  Substance Use Topics   Alcohol use: Not Currently   Drug use: Not Currently    Types: Marijuana    ROS   Objective:   Vitals: BP 114/73 (BP Location: Right Arm)   Pulse 90   Temp 97.7 F (36.5 C) (Oral)   LMP 03/18/2022   SpO2 96%   Physical Exam Constitutional:      General: She is not in acute distress.    Appearance: Normal appearance. She is well-developed. She is not ill-appearing, toxic-appearing or diaphoretic.  HENT:     Head: Normocephalic and atraumatic.     Right Ear: External ear normal.     Left Ear: External ear normal.     Nose: Nose normal.     Mouth/Throat:     Mouth: Mucous membranes are moist.  Eyes:     General: No scleral icterus.       Right eye: No discharge.        Left eye: No discharge.     Extraocular Movements: Extraocular movements intact.  Cardiovascular:     Rate and Rhythm: Normal rate.  Pulmonary:     Effort: Pulmonary effort is normal.  Musculoskeletal:     Cervical back: Normal range of motion and neck supple. Tenderness (over areas outlined) present. No swelling, edema, deformity,  erythema, signs of trauma, lacerations, rigidity, spasms, torticollis, bony tenderness or crepitus. Pain with movement present. No spinous process tenderness or muscular tenderness. Normal range of motion.     Thoracic back: No swelling, edema, deformity, signs of trauma, lacerations, spasms, tenderness or bony tenderness. Normal range of motion. No scoliosis.     Lumbar back: No swelling, edema, deformity, signs of trauma, lacerations, spasms, tenderness or bony tenderness. Normal range of motion. Negative right straight leg raise test and negative left straight leg raise test. No scoliosis.       Back:  Skin:    General: Skin is warm and dry.  Neurological:     General: No focal deficit present.     Mental Status: She is alert and  oriented to person, place, and time.     Cranial Nerves: No cranial nerve deficit.     Motor: No weakness.     Coordination: Coordination normal.     Gait: Gait normal.     Deep Tendon Reflexes: Reflexes normal.  Psychiatric:        Mood and Affect: Mood normal.        Behavior: Behavior normal.     Assessment and Plan :   PDMP not reviewed this encounter.  1. Neck pain   2. Upper back pain   3. [redacted] weeks gestation of pregnancy    Will defer imaging as there is lack of trauma and also patient is pregnant.  Recommended conservative management with Tylenol, good back care.  Counseled patient on potential for adverse effects with medications prescribed/recommended today, ER and return-to-clinic precautions discussed, patient verbalized understanding.    Wallis Bamberg, [place] 09/14/22 1313

## 2022-10-17 ENCOUNTER — Other Ambulatory Visit: Payer: Self-pay

## 2022-10-17 ENCOUNTER — Emergency Department (HOSPITAL_COMMUNITY)
Admission: EM | Admit: 2022-10-17 | Discharge: 2022-10-17 | Disposition: A | Discharge status: Home / Self Care | Payer: 59 | Attending: Emergency Medicine | Admitting: Emergency Medicine

## 2022-10-17 ENCOUNTER — Encounter (HOSPITAL_COMMUNITY): Payer: Self-pay

## 2022-10-17 DIAGNOSIS — Z3A28 28 weeks gestation of pregnancy: Secondary | ICD-10-CM | POA: Insufficient documentation

## 2022-10-17 DIAGNOSIS — O368130 Decreased fetal movements, third trimester, not applicable or unspecified: Secondary | ICD-10-CM | POA: Insufficient documentation

## 2022-10-17 NOTE — ED Triage Notes (Signed)
[redacted]weeks pregnant, no medical issues with pregnancy. G3P2.  Decreased fetal movement and pt describes feeling baby movements as "jolty". This started after pt has having intercourse.

## 2022-10-17 NOTE — ED Notes (Signed)
Rapid OB called  

## 2022-10-17 NOTE — Progress Notes (Addendum)
Pt presented to [facility]  as G4P2 at 29.1wk after having sex with partner in "new position"  approximately 2 days ago and feeling like she mashed baby too hard.  She reports decreased fetal movement ever sense.  Pt denies LOF, vaginal bleeding, and UCs.  Fetal heart rate monitors applied and infant audible fetal movements are noted almost immediately.  Pt reports feeling some of these movements.  She receives [facility] at [employer].  Her only problems with this pregnancy are low lying placenta and some dehydration early on. Monitors have been applied and fhr is reactive and reassuring. Dr Shawnie Pons notified of all of this and OB clears pt.  RROB educates pt about kick counts and pt verbalizes understanding.

## 2022-10-17 NOTE — ED Provider Notes (Signed)
[place] EMERGENCY DEPARTMENT AT [facility] Provider Note   CSN: [id number] Arrival date & time: 10/17/22  1640     History  Chief Complaint  Patient presents with   Decreaed Fetal Movement    Miranda Drake is a 32 y.o. female, G3P2, currently [redacted] weeks pregnant presents to the ED with concerns of decreased fetal movement.  She states she was trying a new position during sexual intercourse and felt like she "squished the baby".  Patient states that she felt decreased fetal movement following this and that when she did feel him move it felt "jerky".  Denies abdominal pain, pelvic pain, vaginal discharge or bleeding.       Home Medications Prior to Admission medications   Medication Sig Start Date End Date Taking? Authorizing Provider  acetaminophen (TYLENOL) 325 MG tablet Take 2 tablets (650 mg total) by mouth every 6 (six) hours as needed for moderate pain. 09/14/22   Wallis Bamberg, PA-C  clobetasol cream (TEMOVATE) 0.05 % Apply 1 application topically 2 (two) times daily. Apply to affected area Patient not taking: Reported on 08/31/2020 09/01/19   Willodean Rosenthal, MD  clobetasol cream (TEMOVATE) 0.05 % Apply 1 application topically 2 (two) times daily. Apply to affected area Patient not taking: Reported on 08/31/2020 09/11/19   Willodean Rosenthal, MD  ketoconazole (NIZORAL) 2 % cream Apply 1 Application topically daily. 09/10/22   Wallis Bamberg, PA-C  meloxicam (MOBIC) 15 MG tablet Take 15 mg by mouth daily.    [provider]  metroNIDAZOLE (FLAGYL) 500 MG tablet Take 1 tablet (500 mg total) by mouth 2 (two) times daily. One po bid x 7 days Patient not taking: Reported on 08/31/2020 04/10/20   Charlynne Pander, MD  orphenadrine (NORFLEX) 100 MG tablet Take 1 tablet (100 mg total) by mouth 2 (two) times daily. 11/18/20   Arby Barrette, MD  tiZANidine (ZANAFLEX) 2 MG tablet Take by mouth every 6 (six) hours as needed for muscle spasms.    [provider]      Allergies    Patient has no known allergies.    Review of Systems   Review of Systems  Gastrointestinal:  Negative for abdominal pain.  Genitourinary:  Negative for pelvic pain, vaginal bleeding and vaginal discharge.    Physical Exam Updated Vital Signs BP 129/81 (BP Location: Right Arm)   Pulse 85   Temp 98.1 F (36.7 C) (Oral)   Resp 18   Ht 5\' 1"  (1.549 m)   Wt 90.7 kg   LMP 03/18/2022   SpO2 99%   BMI 37.79 kg/m  Physical Exam Vitals and nursing note reviewed.  Constitutional:      General: She is not in acute distress.    Appearance: Normal appearance. She is not ill-appearing or diaphoretic.  Cardiovascular:     Rate and Rhythm: Normal rate and regular rhythm.  Pulmonary:     Effort: Pulmonary effort is normal.  Abdominal:     Palpations: Abdomen is soft.     Tenderness: There is no abdominal tenderness.  Skin:    General: Skin is warm and dry.     Capillary Refill: Capillary refill takes less than 2 seconds.  Neurological:     Mental Status: She is alert. Mental status is at baseline.  Psychiatric:        Mood and Affect: Mood normal.        Behavior: Behavior normal.     ED Results / Procedures / Treatments  Labs (all labs ordered are listed, but only abnormal results are displayed) Labs Reviewed - No data to display  EKG None  Radiology No results found.  Procedures Procedures    Medications Ordered in ED Medications - No data to display  ED Course/ Medical Decision Making/ A&P                             Medical Decision Making  This patient presents to the ED with chief complaint(s) of decreased fetal movement with pertinent past medical history of G3P2, currently [redacted]wk gestation, low lying placenta.  The complaint involves an extensive differential diagnosis and also carries with it a high risk of complications and morbidity.    The differential diagnosis includes fetal demise, fetal impairment, fetal sleep  state  Initial Assessment:   Overall well appearing pregnant female patient who is not in acute distress.  Skin is warm and try.  Abdomen is soft and non tender with no evidence of trauma.    Treatment and Reassessment: Rapid OB team was paged and came to assess patient.  Fetal heart tones and movement were both reassuring.  I performed bedside ultrasound to give patient further peace of mind and fetus was identified with active movement and cardiac activity.    Disposition:   Patient's evaluation overall reassuring.  Advised patient to follow up with OB as scheduled and return to MAU or ED if she has concerns.  The patient has been appropriately medically screened and/or stabilized in the ED. I have low suspicion for any other emergent medical condition which would require further screening, evaluation or treatment in the ED or require inpatient management. At time of discharge the patient is hemodynamically stable and in no acute distress. I have discussed work-up results and diagnosis with patient and answered all questions. Patient is agreeable with discharge plan. We discussed strict return precautions for returning to the emergency department and they verbalized understanding.            Final Clinical Impression(s) / ED Diagnoses Final diagnoses:  Decreased fetal movements in third trimester, single or unspecified fetus    Rx / DC Orders ED Discharge Orders     None         Barrie Lyme 10/17/22 1913    Linwood Dibbles, MD 10/17/22 2351

## 2022-10-17 NOTE — Discharge Instructions (Addendum)
Thank you for allowing [place] to be part of your care today.  You were evaluated in the ED with concerns of decreased fetal movement.  You were seen by the rapid OB team who confirmed that your baby had good movement and fetal heart rate.  We were also able to see this on a quick bedside ultrasound.  If you continue to have concerns, I recommend returning to the MAU or ED for evaluation.  Please follow-up with your OB as scheduled.

## 2022-11-07 DIAGNOSIS — O26893 Other specified pregnancy related conditions, third trimester: Secondary | ICD-10-CM | POA: Diagnosis not present

## 2022-11-07 DIAGNOSIS — Z6791 Unspecified blood type, Rh negative: Secondary | ICD-10-CM | POA: Diagnosis not present

## 2022-11-07 DIAGNOSIS — Z2913 Encounter for prophylactic Rho(D) immune globulin: Secondary | ICD-10-CM | POA: Diagnosis not present

## 2022-11-07 DIAGNOSIS — O4443 Low lying placenta NOS or without hemorrhage, third trimester: Secondary | ICD-10-CM | POA: Diagnosis not present

## 2022-11-07 DIAGNOSIS — Z3A32 32 weeks gestation of pregnancy: Secondary | ICD-10-CM | POA: Diagnosis not present

## 2022-11-07 DIAGNOSIS — Z3689 Encounter for other specified antenatal screening: Secondary | ICD-10-CM | POA: Diagnosis not present

## 2022-11-07 DIAGNOSIS — O26899 Other specified pregnancy related conditions, unspecified trimester: Secondary | ICD-10-CM | POA: Diagnosis not present

## 2022-11-09 DIAGNOSIS — R7309 Other abnormal glucose: Secondary | ICD-10-CM | POA: Diagnosis not present

## 2022-12-07 DIAGNOSIS — Z3A36 36 weeks gestation of pregnancy: Secondary | ICD-10-CM | POA: Diagnosis not present

## 2022-12-07 DIAGNOSIS — O4443 Low lying placenta NOS or without hemorrhage, third trimester: Secondary | ICD-10-CM | POA: Diagnosis not present

## 2022-12-14 DIAGNOSIS — Z3689 Encounter for other specified antenatal screening: Secondary | ICD-10-CM | POA: Diagnosis not present

## 2022-12-29 DIAGNOSIS — O99824 Streptococcus B carrier state complicating childbirth: Secondary | ICD-10-CM | POA: Diagnosis not present

## 2022-12-29 DIAGNOSIS — Z6741 Type O blood, Rh negative: Secondary | ICD-10-CM | POA: Diagnosis not present

## 2022-12-29 DIAGNOSIS — Z3A39 39 weeks gestation of pregnancy: Secondary | ICD-10-CM | POA: Diagnosis not present

## 2023-02-01 DIAGNOSIS — Z01818 Encounter for other preprocedural examination: Secondary | ICD-10-CM | POA: Diagnosis not present

## 2023-02-06 DIAGNOSIS — N939 Abnormal uterine and vaginal bleeding, unspecified: Secondary | ICD-10-CM | POA: Diagnosis not present

## 2023-02-06 DIAGNOSIS — N838 Other noninflammatory disorders of ovary, fallopian tube and broad ligament: Secondary | ICD-10-CM | POA: Diagnosis not present

## 2023-02-06 DIAGNOSIS — E669 Obesity, unspecified: Secondary | ICD-10-CM | POA: Diagnosis not present

## 2023-02-06 DIAGNOSIS — Z302 Encounter for sterilization: Secondary | ICD-10-CM | POA: Diagnosis not present

## 2023-02-06 DIAGNOSIS — Z6834 Body mass index (BMI) 34.0-34.9, adult: Secondary | ICD-10-CM | POA: Diagnosis not present

## 2023-02-06 DIAGNOSIS — Z87891 Personal history of nicotine dependence: Secondary | ICD-10-CM | POA: Diagnosis not present

## 2023-02-06 DIAGNOSIS — Z5321 Procedure and treatment not carried out due to patient leaving prior to being seen by health care provider: Secondary | ICD-10-CM | POA: Diagnosis not present

## 2023-02-07 DIAGNOSIS — Z302 Encounter for sterilization: Secondary | ICD-10-CM | POA: Diagnosis not present

## 2023-02-07 DIAGNOSIS — N939 Abnormal uterine and vaginal bleeding, unspecified: Secondary | ICD-10-CM | POA: Diagnosis not present

## 2023-02-13 DIAGNOSIS — Z09 Encounter for follow-up examination after completed treatment for conditions other than malignant neoplasm: Secondary | ICD-10-CM | POA: Diagnosis not present

## 2023-02-20 ENCOUNTER — Other Ambulatory Visit (HOSPITAL_BASED_OUTPATIENT_CLINIC_OR_DEPARTMENT_OTHER): Payer: Self-pay

## 2023-02-20 MED ORDER — FLULAVAL 0.5 ML IM SUSY
0.5000 mL | PREFILLED_SYRINGE | Freq: Once | INTRAMUSCULAR | 0 refills | Status: AC
  Filled 2023-02-20: qty 0.5, 1d supply, fill #0

## 2023-06-01 ENCOUNTER — Other Ambulatory Visit: Payer: Self-pay

## 2023-06-01 ENCOUNTER — Emergency Department (HOSPITAL_COMMUNITY)
Admission: EM | Admit: 2023-06-01 | Discharge: 2023-06-01 | Disposition: A | Discharge status: Home / Self Care | Payer: PRIVATE HEALTH INSURANCE | Attending: Emergency Medicine | Admitting: Emergency Medicine

## 2023-06-01 DIAGNOSIS — H5789 Other specified disorders of eye and adnexa: Secondary | ICD-10-CM | POA: Diagnosis not present

## 2023-06-01 DIAGNOSIS — T6591XA Toxic effect of unspecified substance, accidental (unintentional), initial encounter: Secondary | ICD-10-CM | POA: Diagnosis present

## 2023-06-01 DIAGNOSIS — Z77098 Contact with and (suspected) exposure to other hazardous, chiefly nonmedicinal, chemicals: Secondary | ICD-10-CM

## 2023-06-01 NOTE — ED Triage Notes (Signed)
[facility] She is an Human resources officer. States that she was spiking a bag (Taxol) when it splash on her right side of her face, and corner of eye. Denies any pain, itching. No vision changes. Pt had eyes and face rinse upon arrival.

## 2023-06-01 NOTE — ED Provider Notes (Signed)
[place] [facility] AT [facility] Provider Note   CSN: [id number] Arrival date & time: 06/01/23  1420     History  No chief complaint on file.   Miranda Drake is a 33 y.o. female.  Patient is an employee in the cancer center.  Patient was spiking a bag of Taxol and it splashed in her right eye and the right side of her face.  Patient has washed her face.  Face and eye have been irrigated.  Patient was sent here for evaluation.  She denies any visual changes she denies any eye pain.  She denies any facial discomfort.  Patient reports that she was wearing a gown and protective clothing.  The history is provided by the patient. No language interpreter was used.       Home Medications Prior to Admission medications   Medication Sig Start Date End Date Taking? Authorizing Provider  acetaminophen (TYLENOL) 325 MG tablet Take 2 tablets (650 mg total) by mouth every 6 (six) hours as needed for moderate pain. 09/14/22   Wallis Bamberg, PA-C  clobetasol cream (TEMOVATE) 0.05 % Apply 1 application topically 2 (two) times daily. Apply to affected area Patient not taking: Reported on 08/31/2020 09/01/19   Willodean Rosenthal, MD  clobetasol cream (TEMOVATE) 0.05 % Apply 1 application topically 2 (two) times daily. Apply to affected area Patient not taking: Reported on 08/31/2020 09/11/19   Willodean Rosenthal, MD  ketoconazole (NIZORAL) 2 % cream Apply 1 Application topically daily. 09/10/22   Wallis Bamberg, PA-C  meloxicam (MOBIC) 15 MG tablet Take 15 mg by mouth daily.    [provider]  metroNIDAZOLE (FLAGYL) 500 MG tablet Take 1 tablet (500 mg total) by mouth 2 (two) times daily. One po bid x 7 days Patient not taking: Reported on 08/31/2020 04/10/20   Charlynne Pander, MD  orphenadrine (NORFLEX) 100 MG tablet Take 1 tablet (100 mg total) by mouth 2 (two) times daily. 11/18/20   Arby Barrette, MD  tiZANidine (ZANAFLEX) 2 MG tablet Take by mouth every 6  (six) hours as needed for muscle spasms.    [provider]      Allergies    Patient has no known allergies.    Review of Systems   Review of Systems  All other systems reviewed and are negative.   Physical Exam Updated Vital Signs BP (!) 152/105 (BP Location: Right Arm)   Pulse 82   Temp 98.1 F (36.7 C) (Oral)   Resp 18   Ht 5\' 2"  (1.575 m)   SpO2 100%   BMI 36.58 kg/m  Physical Exam Vitals reviewed.  HENT:     Mouth/Throat:     Mouth: Mucous membranes are moist.  Eyes:     Extraocular Movements: Extraocular movements intact.     Conjunctiva/sclera: Conjunctivae normal.     Pupils: Pupils are equal, round, and reactive to light.  Cardiovascular:     Rate and Rhythm: Normal rate.  Pulmonary:     Effort: Pulmonary effort is normal.  Musculoskeletal:        General: Normal range of motion.  Skin:    General: Skin is warm.  Neurological:     General: No focal deficit present.     Mental Status: She is alert.  Psychiatric:        Mood and Affect: Mood normal.     ED Results / Procedures / Treatments   Labs (all labs ordered are listed, but only abnormal results are  displayed) Labs Reviewed - No data to display  EKG None  Radiology No results found.  Procedures Procedures    Medications Ordered in ED Medications - No data to display  ED Course/ Medical Decision Making/ A&P                                 Medical Decision Making Amount and/or Complexity of Data Reviewed Discussion of management or test interpretation with external provider(s): I spoke with poison control who advised patient should rinse area and for 15 minutes.  They report there can be some skin irritation. I reviewed the MSDS from [employer].  This is advised rinsing area for 15 minutes.  Risk Risk Details: Patient counseled on exposure.  She is advised to return for recheck if any problems.           Final Clinical Impression(s) / ED Diagnoses Final diagnoses:   Chemical exposure of eye  Chemical exposure    Rx / DC Orders ED Discharge Orders     None      An After Visit Summary was printed and given to the patient.    Osie Cheeks 06/01/23 2036    Bethann Berkshire, MD 06/04/23 1055

## 2023-06-01 NOTE — Discharge Instructions (Signed)
Return if any problems.

## 2023-11-01 ENCOUNTER — Ambulatory Visit: Payer: Self-pay

## 2023-11-01 NOTE — Telephone Encounter (Signed)
 FYI Only or Action Required?: FYI only for provider.  Patient was last seen in primary care on New Patient.  Called Nurse Triage reporting [place].  Symptoms began x 2 years ago.  Interventions attempted: Nothing.  Symptoms are: unchanged.  Triage Disposition: Home Care  Patient/caregiver understands and will follow disposition?: Yes  **New Patient appt scheduled for 9/9**                                  Copied from CRM [id number]. Topic: Clinical - Red Word Triage >> Nov 01, 2023  5:06 PM Mia F wrote: Red Word that prompted transfer to Nurse Triage: PT called to schedule appt but when asked for reason for appt she mentioned that she would like to be seen for these bumps she keeps getting on her skin. Pt states they are getting worse. No other symptoms associated. She did say the bumps are increasing. Reaching to NT for assistance    ----------------------------------------------------------------------- From previous Reason for Contact - Scheduling: Patient/patient representative is calling to schedule an appointment. Refer to attachments for appointment information. Reason for Disposition  Pimple, not a boil  Answer Assessment - Initial Assessment Questions 1. APPEARANCE of BOIL: What does the boil look like?  Pimple like  2. LOCATION: Where is the boil located?      Side of face, around jaw line and forehead  3. NUMBER: How many boils are there?      Several   4. SIZE: How big is the boil? (e.g., inches, cm; compare to size of a coin or other object)      5. ONSET: When did the boil start?     X 2 years ago  6. PAIN: Is there any pain? If Yes, ask: How bad is the pain?   (Scale 1-10; or mild, moderate, severe)      Mild, only with touch  7. FEVER: Do you have a fever? If Yes, ask: What is it, how was it measured, and when did it start?      No   9. OTHER SYMPTOMS: Do you have any other symptoms? (e.g., shaking  chills, weakness, rash elsewhere on body) No    10. PREGNANCY: Is there any chance you are pregnant? When was your last menstrual period?   No  Protocols used: Boil (Skin Abscess)-A-AH

## 2023-11-01 NOTE — Telephone Encounter (Deleted)
 Copied from CRM [id number]. Topic: Clinical - Red Word Triage >> Nov 01, 2023  5:06 PM Mia F wrote: Red Word that prompted transfer to Nurse Triage: PT called to schedule appt but when asked for reason for appt she mentioned that she would like to be seen for these bumps she keeps getting on her skin. Pt states they are getting worse. No other symptoms associated. She did say the bumps are increasing. Reaching to NT for assistance    ----------------------------------------------------------------------- From previous Reason for Contact - Scheduling: Patient/patient representative is calling to schedule an appointment. Refer to attachments for appointment information.

## 2023-12-25 ENCOUNTER — Ambulatory Visit (INDEPENDENT_AMBULATORY_CARE_PROVIDER_SITE_OTHER): Admitting: Adult Health

## 2023-12-25 ENCOUNTER — Encounter: Payer: Self-pay | Admitting: Adult Health

## 2023-12-25 VITALS — BP 110/80 | HR 81 | Temp 97.6°F | Ht 61.25 in | Wt 212.0 lb

## 2023-12-25 DIAGNOSIS — Z136 Encounter for screening for cardiovascular disorders: Secondary | ICD-10-CM

## 2023-12-25 DIAGNOSIS — Z0000 Encounter for general adult medical examination without abnormal findings: Secondary | ICD-10-CM | POA: Diagnosis not present

## 2023-12-25 DIAGNOSIS — L700 Acne vulgaris: Secondary | ICD-10-CM

## 2023-12-25 DIAGNOSIS — Z23 Encounter for immunization: Secondary | ICD-10-CM

## 2023-12-25 MED ORDER — CLINDAMYCIN PHOS (TWICE-DAILY) 1 % EX GEL: Freq: Two times a day (BID) | CUTANEOUS | 0 refills | Status: AC

## 2023-12-25 NOTE — Progress Notes (Unsigned)
 Patient presents to clinic today to establish care. She is a 33 year old female who  has a past medical history of Medical history non-contributory.   Acute Concerns: Establish Care   Chronic Issues: Acne -this has been an ongoing issue for roughly 2 years.  She has used various over-the-counter cleansers but found much in the way of clearing.  Acne is along her cheeks, forehead, and chin.  Health Maintenance: Dental --Does not do routine care  Vision -- Does not do routine care.  Immunizations -- Colonoscopy -- Never had  Mammogram -- Never had  PAP -- UTD.    Past Medical History:  Diagnosis Date   Medical history non-contributory     Past Surgical History:  Procedure Laterality Date   NO PAST SURGERIES     TONSILLECTOMY      Current Outpatient Medications on File Prior to Visit  Medication Sig Dispense Refill   Multiple Vitamins-Minerals (MULTIVITAMIN WITH MINERALS) tablet Take 1 tablet by mouth daily.     No current facility-administered medications on file prior to visit.    No Known Allergies  Family History  Problem Relation Age of Onset   Hyperlipidemia Mother    Hypertension Mother    Hyperlipidemia Sister     Social History   Socioeconomic History   Marital status: Single    Spouse name: Not on file   Number of children: Not on file   Years of education: Not on file   Highest education level: Not on file  Occupational History   Not on file  Tobacco Use   Smoking status: Former    Current packs/day: 0.30    Types: Cigarettes   Smokeless tobacco: Never  Vaping Use   Vaping status: Former  Substance and Sexual Activity   Alcohol use: Not Currently   Drug use: Not Currently    Types: Marijuana   Sexual activity: Not on file  Other Topics Concern   Not on file  Social History Narrative   Not on file   Social Drivers of Health   Financial Resource Strain: Not on file  Food Insecurity: Medium Risk (12/29/2022)   Received from [facility]   Hunger Vital Sign    Within the past 12 months, you worried that your food would run out before you got money to buy more: Sometimes true    Within the past 12 months, the food you bought just didn't last and you didn't have money to get more. : Sometimes true  Transportation Needs: No Transportation Needs (12/29/2022)   Received from [facility]    In the past 12 months, has lack of reliable transportation kept you from medical appointments, meetings, work or from getting things needed for daily living? : No  Physical Activity: Not on file  Stress: Not on file  Social Connections: Unknown (08/16/2021)   Received from [facility]   Social Network    Social Network: Not on file  Intimate Partner Violence: Unknown (07/21/2021)   Received from [facility]   HITS    Physically Hurt: Not on file    Insult or Talk Down To: Not on file    Threaten Physical Harm: Not on file    Scream or Curse: Not on file    Review of Systems  Constitutional: Negative.   HENT: Negative.    Eyes: Negative.   Respiratory: Negative.    Cardiovascular: Negative.   Gastrointestinal: Negative.   Genitourinary: Negative.  Musculoskeletal: Negative.   Skin: Negative.   Neurological: Negative.   Psychiatric/Behavioral: Negative.      BP 110/80   Pulse 81   Temp 97.6 F (36.4 C) (Oral)   Ht 5' 1.25 (1.556 m)   Wt 212 lb (96.2 kg)   LMP 12/03/2023 (Approximate)   SpO2 99%   Breastfeeding No   BMI 39.73 kg/m   Physical Exam Vitals and nursing note reviewed.  Constitutional:      General: She is not in acute distress.    Appearance: Normal appearance. She is not ill-appearing.  HENT:     Head: Normocephalic and atraumatic.     Right Ear: Tympanic membrane, ear canal and external ear normal. There is no impacted cerumen.     Left Ear: Tympanic membrane, ear canal and external ear normal. There is no impacted cerumen.     Nose: Nose normal. No congestion or rhinorrhea.      Mouth/Throat:     Mouth: Mucous membranes are moist.     Pharynx: Oropharynx is clear.  Eyes:     Extraocular Movements: Extraocular movements intact.     Conjunctiva/sclera: Conjunctivae normal.     Pupils: Pupils are equal, round, and reactive to light.  Neck:     Vascular: No carotid bruit.  Cardiovascular:     Rate and Rhythm: Normal rate and regular rhythm.     Pulses: Normal pulses.     Heart sounds: No murmur heard.    No friction rub. No gallop.  Pulmonary:     Effort: Pulmonary effort is normal.     Breath sounds: Normal breath sounds.  Abdominal:     General: Abdomen is flat. Bowel sounds are normal. There is no distension.     Palpations: Abdomen is soft. There is no mass.     Tenderness: There is no abdominal tenderness. There is no guarding or rebound.     Hernia: No hernia is present.  Musculoskeletal:        General: Normal range of motion.     Cervical back: Normal range of motion and neck supple.  Lymphadenopathy:     Cervical: No cervical adenopathy.  Skin:    General: Skin is warm and dry.     Capillary Refill: Capillary refill takes less than 2 seconds.     Findings: Acne present.  Neurological:     General: No focal deficit present.     Mental Status: She is alert and oriented to person, place, and time.  Psychiatric:        Mood and Affect: Mood normal.        Behavior: Behavior normal.        Thought Content: Thought content normal.        Judgment: Judgment normal.      Assessment/Plan: 1. Routine general medical examination at a health care facility (Primary) Today patient counseled on age appropriate routine health concerns for screening and prevention, each reviewed and up to date or declined. Immunizations reviewed and up to date or declined. Labs ordered and reviewed. Risk factors for depression reviewed and negative. Hearing function and visual acuity are intact. ADLs screened and addressed as needed. Functional ability and level of  safety reviewed and appropriate. Education, counseling and referrals performed based on assessed risks today. Patient provided with a copy of personalized plan for preventive services. - Follow up in 1 year for CPE - Follow up sooner if needed  2. Acne vulgaris - Will trial her on Clindagel  x 3 months. Follow up then. Consider referral to Dermatology. Advised using a good facial mositurizer such as Cetaphil and gentle daily face wash.  - clindamycin  (CLINDAGEL) 1 % gel; Apply topically 2 (two) times daily.  Dispense: 30 g; Refill: 0  3. Screening for cardiovascular condition  - CBC with Differential/Platelet; Future - Comprehensive metabolic panel with GFR; Future - Lipid panel; Future - TSH; Future - Hemoglobin A1c; Future  4. Need for influenza vaccination  - Flu vaccine trivalent PF, 6mos and older(Flulaval ,Afluria,Fluarix,Fluzone)   Darleene Shape, NP
